# Patient Record
Sex: Male | Born: 1945 | Race: Black or African American | Hispanic: No | Marital: Married | Smoking: Never smoker
Health system: Southern US, Community
[De-identification: ages and names within clinical notes are randomized; demographics above are authoritative.]

## PROBLEM LIST (undated history)

## (undated) DIAGNOSIS — C61 Malignant neoplasm of prostate: Secondary | ICD-10-CM

## (undated) DIAGNOSIS — Z923 Personal history of irradiation: Secondary | ICD-10-CM

## (undated) HISTORY — PX: CIRCUMCISION: SUR203

---

## 2005-03-15 ENCOUNTER — Ambulatory Visit (HOSPITAL_BASED_OUTPATIENT_CLINIC_OR_DEPARTMENT_OTHER): Admission: RE | Admit: 2005-03-15 | Discharge: 2005-03-15 | Payer: Self-pay | Admitting: Urology

## 2005-03-15 ENCOUNTER — Ambulatory Visit (HOSPITAL_COMMUNITY): Admission: RE | Admit: 2005-03-15 | Discharge: 2005-03-15 | Payer: Self-pay | Admitting: Urology

## 2006-10-27 ENCOUNTER — Encounter: Admission: RE | Admit: 2006-10-27 | Discharge: 2006-10-27 | Payer: Self-pay | Admitting: General Practice

## 2010-02-25 ENCOUNTER — Encounter (INDEPENDENT_AMBULATORY_CARE_PROVIDER_SITE_OTHER): Payer: Self-pay | Admitting: *Deleted

## 2010-03-29 ENCOUNTER — Ambulatory Visit: Payer: Self-pay | Admitting: Gastroenterology

## 2010-03-29 ENCOUNTER — Encounter (INDEPENDENT_AMBULATORY_CARE_PROVIDER_SITE_OTHER): Payer: Self-pay | Admitting: *Deleted

## 2010-03-29 DIAGNOSIS — R1033 Periumbilical pain: Secondary | ICD-10-CM | POA: Insufficient documentation

## 2010-03-30 ENCOUNTER — Encounter (INDEPENDENT_AMBULATORY_CARE_PROVIDER_SITE_OTHER): Payer: Self-pay | Admitting: *Deleted

## 2010-03-30 LAB — CONVERTED CEMR LAB
AST: 27 units/L (ref 0–37)
BUN: 23 mg/dL (ref 6–23)
CO2: 27 meq/L (ref 19–32)
Calcium: 8.9 mg/dL (ref 8.4–10.5)
HCT: 43.8 % (ref 39.0–52.0)
Lymphocytes Relative: 34.3 % (ref 12.0–46.0)
Lymphs Abs: 1.8 10*3/uL (ref 0.7–4.0)
MCHC: 34.6 g/dL (ref 30.0–36.0)
Neutrophils Relative %: 46.4 % (ref 43.0–77.0)
Potassium: 4.8 meq/L (ref 3.5–5.1)
RDW: 13.4 % (ref 11.5–14.6)
Sed Rate: 5 mm/hr (ref 0–22)
Sodium: 140 meq/L (ref 135–145)

## 2010-04-26 ENCOUNTER — Ambulatory Visit: Payer: Self-pay | Admitting: Gastroenterology

## 2010-04-29 ENCOUNTER — Encounter: Payer: Self-pay | Admitting: Gastroenterology

## 2010-11-05 ENCOUNTER — Emergency Department (HOSPITAL_COMMUNITY)
Admission: EM | Admit: 2010-11-05 | Discharge: 2010-11-05 | Payer: Self-pay | Source: Home / Self Care | Admitting: Emergency Medicine

## 2010-11-08 ENCOUNTER — Emergency Department (HOSPITAL_COMMUNITY)
Admission: EM | Admit: 2010-11-08 | Discharge: 2010-11-08 | Payer: Self-pay | Source: Home / Self Care | Admitting: Emergency Medicine

## 2010-12-08 NOTE — Letter (Signed)
Summary: [facility] Instructions  [facility]  [address] [place], [place] [address]   Phone: [phone]  Fax: [phone]       BART ASHFORD    66-Jan-1947    MRN: [id number]        Procedure Day /Date:04/26/10     Arrival Time:1230 pm     Procedure Time:130 pm     Location of Procedure:                    [facility] (4th Floor)                        PREPARATION FOR COLONOSCOPY WITH MOVIPREP   Starting 5 days prior to your procedure 04/21/10 do not eat nuts, seeds, popcorn, corn, beans, peas,  salads, or any raw vegetables.  Do not take any fiber supplements (e.g. Metamucil, Citrucel, and Benefiber).  THE DAY BEFORE YOUR PROCEDURE         DATE: 04/25/10  DAY: SUN  1.  Drink clear liquids the entire day-NO SOLID FOOD  2.  Do not drink anything colored red or purple.  Avoid juices with pulp.  No orange juice.  3.  Drink at least 64 oz. (8 glasses) of fluid/clear liquids during the day to prevent dehydration and help the prep work efficiently.  CLEAR LIQUIDS INCLUDE: Water Jello Ice Popsicles Tea (sugar ok, no milk/cream) Powdered fruit flavored drinks Coffee (sugar ok, no milk/cream) Gatorade Juice: apple, white grape, white cranberry  Lemonade Clear bullion, consomm, broth Carbonated beverages (any kind) Strained chicken noodle soup Hard Candy                             4.  In the morning, mix first dose of MoviPrep solution:    Empty 1 Pouch A and 1 Pouch B into the disposable container    Add lukewarm drinking water to the top line of the container. Mix to dissolve    Refrigerate (mixed solution should be used within 24 hrs)  5.  Begin drinking the prep at 5:00 p.m. The MoviPrep container is divided by 4 marks.   Every 15 minutes drink the solution down to the next mark (approximately 8 oz) until the full liter is complete.   6.  Follow completed prep with 16 oz of clear liquid of your choice (Nothing red or purple).   Continue to drink clear liquids until bedtime.  7.  Before going to bed, mix second dose of MoviPrep solution:    Empty 1 Pouch A and 1 Pouch B into the disposable container    Add lukewarm drinking water to the top line of the container. Mix to dissolve    Refrigerate  THE DAY OF YOUR PROCEDURE      DATE:04/26/10 DAY: MON  Beginning at 830 a.m. (5 hours before procedure):         1. Every 15 minutes, drink the solution down to the next mark (approx 8 oz) until the full liter is complete.  2. Follow completed prep with 16 oz. of clear liquid of your choice.    3. You may drink clear liquids until 1130 am (2 HOURS BEFORE PROCEDURE).   MEDICATION INSTRUCTIONS  Unless otherwise instructed, you should take regular prescription medications with a small sip of water   as early as possible the morning of your procedure.  OTHER INSTRUCTIONS  You will need a responsible adult at least 66 years of age to accompany you and drive you home.   This person must remain in the waiting room during your procedure.  Wear loose fitting clothing that is easily removed.  Leave jewelry and other valuables at home.  However, you may wish to bring a book to read or  an iPod/MP3 player to listen to music as you wait for your procedure to start.  Remove all body piercing jewelry and leave at home.  Total time from sign-in until discharge is approximately 2-3 hours.  You should go home directly after your procedure and rest.  You can resume normal activities the  day after your procedure.  The day of your procedure you should not:   Drive   Make legal decisions   Operate machinery   Drink alcohol   Return to work  You will receive specific instructions about eating, activities and medications before you leave.    The above instructions have been reviewed and explained to me by   _______________________    I fully understand and can verbalize these instructions  _____________________________ Date _________

## 2010-12-08 NOTE — Procedures (Signed)
Summary: Colonoscopy  Patient: Marvin Parker Note: All result statuses are Final unless otherwise noted.  Tests: (1) Colonoscopy (COL)   COL Colonoscopy           DONE     [facility]     [address]     [place], [place]  [address]           COLONOSCOPY PROCEDURE REPORT           PATIENT:  Marvin Parker, Marvin Parker  MR#:  [id number]     BIRTHDATE:  [birth year], 63 yrs. old  GENDER:  male     ENDOSCOPIST:  Rachael Fee, MD     PROCEDURE DATE:  04/26/2010     PROCEDURE:  Colonoscopy with biopsy     ASA CLASS:  Class II     INDICATIONS:  abdominal pains     MEDICATIONS:   Fentanyl 75 mcg IV, Versed 9 mg IV           DESCRIPTION OF PROCEDURE:   After the risks benefits and     alternatives of the procedure were thoroughly explained, informed     consent was obtained.  Digital rectal exam was performed and     revealed no rectal masses.   The LB [id number] endoscope     was introduced through the anus and advanced to the terminal ileum     which was intubated for a short distance, without limitations.     The quality of the prep was excellent, using MoviPrep.  The     instrument was then slowly withdrawn as the colon was fully     examined.     <<PROCEDUREIMAGES>>     FINDINGS:  The terminal ileum appeared normal (see image3).  A     sessile polyp was found in the sigmoid colon. This was 1-67mm     across, removed with biopsy forceps (pathology jar 1) (see     image4).  This was otherwise a normal examination of the colon     (see ima     ge2, image5, and image1).   Retroflexed views in the rectum     revealed no abnormalities.    The scope was then withdrawn from     the patient and the procedure completed.           COMPLICATIONS:  None     ENDOSCOPIC IMPRESSION:     1) Normal terminal ileum     2) Sessile polyp in the sigmoid colon; removed and sent to     pathology     3) Otherwise normal examination;  intermittent abd pains are     likely from mild IBS  (irritable bowel syndrome)           RECOMMENDATIONS:  1) If the polyp(s) removed today are proven to     be adenomatous (pre-cancerous) polyps, you will need a repeat     colonoscopy in 5 years. Otherwise you should continue to follow     colorectal cancer screening guidelines for "routine risk" patients     with colonosc     opy in 10 years.     2) You will receive a letter within 1-2 weeks with the results     of your biopsy as well as final recommendations. Please call my     office if you have not received a letter after 3 weeks.           ______________________________  Rachael Fee, MD           n.     Rosalie Doctor:   Rachael Fee at 04/26/2010 01:52 PM           [place], [place], [phone]  Note: An exclamation mark (!) indicates a result that was not dispersed into the flowsheet. Document Creation Date: 04/26/2010 1:53 PM _______________________________________________________________________  (1) Order result status: Final Collection or observation date-time: 04/26/2010 13:47 Requested date-time:  Receipt date-time:  Reported date-time:  Referring Physician:   Ordering Physician: Rob Bunting [phone]) Specimen Source:  Source: Launa Grill Order Number: [id number] Lab site:   Appended Document: Colonoscopyn recall 10 yr     Procedures Next Due Date:    Colonoscopy: 04/2020

## 2010-12-08 NOTE — Assessment & Plan Note (Signed)
History of Present Illness Visit Type: new patient  Primary GI MD: Rob Bunting MD Primary Provider: [facility]([address]) Requesting Provider: na Chief Complaint: stomach problems  History of Present Illness:     very pleasant 65 year old man who has lower abdominal discomfort.  The pains are every day.  Very bothersome burning.  Sometims LUQ discomfort as well.  This has been going on for 5-6 years.  Normal daily BMs, without straining.  Never seen blood in his stool.  Overall his weight is overall stable.  Passing gas will often help, but BMs do not.    nothing tends to bring the discomforts on.            Current Medications (verified): 1)  Tamsulosin Hcl 0.4 Mg Caps (Tamsulosin Hcl) .... One Capsule By Mouth At Bedtime 2)  Zyrtec Allergy 10 Mg Caps (Cetirizine Hcl) .... As Needed  Allergies (verified): No Known Drug Allergies  Past History:  Past Medical History: none  Past Surgical History: none  Family History: no colon cancer  Social History: he is separated, he has 5 children, he is a Company secretary, he does not smoke cigarettes, he drinks one to 2 alcoholic beverages per day  Review of Systems       Pertinent positive and negative review of systems were noted in the above HPI and GI specific review of systems.  All other review of systems was otherwise negative.   Vital Signs:  Patient profile:   65 year old male Height:      68 inches Weight:      173 pounds BMI:     26.40 BSA:     1.92 Pulse rate:   92 / minute Pulse rhythm:   regular BP sitting:   136 / 82  (left arm) Cuff size:   regular  Vitals Entered By: Ok Anis CMA (Mar 29, 2010 10:57 AM)  Physical Exam  Additional Exam:  Constitutional: generally well appearing Psychiatric: alert and oriented times 3 Eyes: extraocular movements intact Mouth: oropharynx moist, no lesions Neck: supple, no lymphadenopathy Cardiovascular: heart regular rate and rythm Lungs: CTA  bilaterally Abdomen: soft, non-tender, non-distended, no obvious ascites, no peritoneal signs, normal bowel sounds Extremities: no lower extremity edema bilaterally Skin: no lesions on visible extremities    Impression & Recommendations:  Problem # 1:  Lower abdominal discomforts passing gas and generally improves his symptoms and so I suspect this is an irritable bowel-like issue.  He has no alarm symptoms but he is never had colon cancer screening from what I can tell. We will arrange for colonoscopy to be done at his soonest convenience. He'll also get a basic set of labs including a CBC, complete metabolic profile, sedimentation rate.  Other Orders: TLB-CBC Platelet - w/Differential (85025-CBCD) TLB-CMP (Comprehensive Metabolic Pnl) (80053-COMP) TLB-Sedimentation Rate (ESR) (85652-ESR)  Patient Instructions: 1)  You will be scheduled to have a colonoscopy. 2)  You will get lab test(s) done today (cbc, cmet, esr). 3)  The medication list was reviewed and reconciled.  All changed / newly prescribed medications were explained.  A complete medication list was provided to the patient / caregiver.  Appended Document: Orders Update/movi    Clinical Lists Changes  Medications: Added new medication of MOVIPREP 100 GM  SOLR (PEG-KCL-NACL-NASULF-NA ASC-C) As per prep instructions. - Signed Rx of MOVIPREP 100 GM  SOLR (PEG-KCL-NACL-NASULF-NA ASC-C) As per prep instructions.;  #1 x 0;  Signed;  Entered by: Chales Abrahams CMA (AAMA);  Authorized by:  Rachael Fee MD;  Method used: [facility] to [facility]. [phone]*, [address], [place], [place]  [address], Ph: [phone] or [phone], Fax: [phone] Orders: Added new Test order of Colonoscopy (Colon) - Signed    Prescriptions: MOVIPREP 100 GM  SOLR (PEG-KCL-NACL-NASULF-NA ASC-C) As per prep instructions.  #1 x 0   Entered by:   Chales Abrahams CMA (AAMA)   Authorized by:   Rachael Fee MD   Signed by:   Chales Abrahams CMA  (AAMA) on 03/29/2010   Method used:   [facility] to        [facility]. [phone]* (retail)       [address]       [place], [place]  [address]       Ph: [phone] or [phone]       Fax: [phone]   RxID:   [id number]

## 2010-12-08 NOTE — Letter (Signed)
Summary: Results Letter  [facility]  [address] [place], [place] [address]   Phone: [phone]  Fax: [phone]        April 29, 2010 MRN: [id number]    Marvin Parker [address] [place], [place]  [address]    Dear [name],   Good news.  The polyp(s) that were removed during your recent procedure were NOT pre-cancerous.  You should continue to follow current colorectal cancer screening guidelines with a repeat colonoscopy in 10 years.  We will therefore put your information in our reminder system and will contact you in 10 years to schedule a repeat procedure.  Please call if you have any questions or concerns.       Sincerely,  Rachael Fee MD  This letter has been electronically signed by your physician.  Appended Document: Results Letter letter mailed.

## 2010-12-08 NOTE — Letter (Signed)
Summary: New Patient letter  [facility]  [address] [place], [place] [address]   Phone: [phone]  Fax: [phone]       02/25/2010 MRN: [id number]  MAKAR SLATTER [address] [place], [place]  [address]  Dear Mr. FIELDEN,  Welcome to the Gastroenterology Division at [facility].    You are scheduled to see Dr.  Rob Bunting on Mar 29, 2010 at 11:00am on the 3rd floor at [facility], [address]  We ask that you try to arrive at our office 15 minutes prior to your appointment time to allow for check-in.  We would like you to complete the enclosed self-administered evaluation form prior to your visit and bring it with you on the day of your appointment.  We will review it with you.  Also, please bring a complete list of all your medications or, if you prefer, bring the medication bottles and we will list them.  Please bring your insurance card so that we may make a copy of it.  If your insurance requires a referral to see a specialist, please bring your referral form from your primary care physician.  Co-payments are due at the time of your visit and may be paid by cash, check or credit card.     Your office visit will consist of a consult with your physician (includes a physical exam), any laboratory testing he/she may order, scheduling of any necessary diagnostic testing (e.g. x-ray, ultrasound, CT-scan), and scheduling of a procedure (e.g. Endoscopy, Colonoscopy) if required.  Please allow enough time on your schedule to allow for any/all of these possibilities.    If you cannot keep your appointment, please call [phone] to cancel or reschedule prior to your appointment date.  This allows [place] the opportunity to schedule an appointment for another patient in need of care.  If you do not cancel or reschedule by 5 p.m. the business day prior to your appointment date, you will be charged a $50.00 late cancellation/no-show fee.    Thank you for  choosing [facility] for your medical needs.  We appreciate the opportunity to care for you.  Please visit [place] at our website  to learn more about our practice.                     Sincerely,                                                             The Gastroenterology Division

## 2010-12-08 NOTE — Letter (Signed)
Summary: Results Letter  [facility]  [address] [place], [place] [address]   Phone: [phone]  Fax: [phone]        Mar 30, 2010 MRN: [id number]    Marvin Parker [address] [place], [place]  [address]    Dear [name],  I have been unable to reach you by phone in regards to your lab results.  The home phone number that we have in our system is incorrect.  Please call our office so that we may correct this information.  Thank you for your time and attention.          Sincerely,  Chales Abrahams CMA (AAMA)  This letter has been electronically signed by your physician.  Appended Document: Results Letter letter mailed

## 2011-01-17 LAB — POCT I-STAT, CHEM 8
BUN: 27 mg/dL — ABNORMAL HIGH (ref 6–23)
Creatinine, Ser: 1.1 mg/dL (ref 0.4–1.5)
Potassium: 4.3 mEq/L (ref 3.5–5.1)
Sodium: 139 mEq/L (ref 135–145)

## 2011-03-25 NOTE — Op Note (Signed)
Marvin Parker, Marvin Parker               ACCOUNT NO.:  [id number]   MEDICAL RECORD NO.:  [id number]          PATIENT TYPE:  AMB   LOCATION:  [facility]                         FACILITY:  [facility]   PHYSICIAN:  Boston Service, M.D.DATE OF BIRTH:  [birth year]   DATE OF PROCEDURE:  03/15/2005  DATE OF DISCHARGE:                                 OPERATIVE REPORT   References made office note from Mar 08, 2005.   PREOPERATIVE DIAGNOSES:  A 65 year old male, native of [place], phimosis  balanitis, requests circumcision.   POSTOPERATIVE DIAGNOSES:  A 65 year old male, native of [place], phimosis  balanitis, requests circumcision.   PROCEDURE:  Circumcision.   SURGEON:  Boston Service, M.D.   ASSISTANT:  None.   ANESTHESIA:  General.   SPECIMENS:  Foreskin.   ESTIMATED BLOOD LOSS:  Minimal.   COMPLICATIONS:  None obvious.   DESCRIPTION OF PROCEDURE:  The patient was prepped and draped in the supine  position after institution of an adequate level of general anesthesia.  Penile block 0.25% lidocaine without epinephrine was instituted, a total of  about 8-12 mL were used. A circumferential incision was then made proximal  to the subcoronal sulcus. A similar incision was made proximal to the  original incision and a ring of redundant preputial skin was removed in a  parallel lines technique.  Bleeding sites were lightly cauterized with  needle tip Bovie, subcu was reapproximated with  interrupted sutures of 4-0 Vicryl. The skin was reapproximated with  interrupted sutures of 4-0 chromic. The wound was covered with bacitracin  ointment, dry gauze and Coban tape. The patient was returned to recovery in  satisfactory condition.      RH/MEDQ  D:  03/15/2005  T:  03/15/2005  Job:  [id number]   cc:   Gabriel Earing, M.D.  [address]  [place]  [place] [address]  Fax: [phone]

## 2012-07-02 ENCOUNTER — Encounter (HOSPITAL_COMMUNITY): Payer: Self-pay

## 2012-07-02 ENCOUNTER — Emergency Department (HOSPITAL_COMMUNITY)
Admission: EM | Admit: 2012-07-02 | Discharge: 2012-07-02 | Disposition: A | Discharge status: Home / Self Care | Payer: Medicare Other | Attending: Emergency Medicine | Admitting: Emergency Medicine

## 2012-07-02 ENCOUNTER — Emergency Department (HOSPITAL_COMMUNITY): Payer: Medicare Other

## 2012-07-02 DIAGNOSIS — S5000XA Contusion of unspecified elbow, initial encounter: Secondary | ICD-10-CM | POA: Insufficient documentation

## 2012-07-02 DIAGNOSIS — Y990 Civilian activity done for income or pay: Secondary | ICD-10-CM | POA: Insufficient documentation

## 2012-07-02 DIAGNOSIS — M545 Low back pain, unspecified: Secondary | ICD-10-CM

## 2012-07-02 DIAGNOSIS — Y9289 Other specified places as the place of occurrence of the external cause: Secondary | ICD-10-CM | POA: Insufficient documentation

## 2012-07-02 DIAGNOSIS — W19XXXA Unspecified fall, initial encounter: Secondary | ICD-10-CM | POA: Insufficient documentation

## 2012-07-02 DIAGNOSIS — S5002XA Contusion of left elbow, initial encounter: Secondary | ICD-10-CM

## 2012-07-02 DIAGNOSIS — S6390XA Sprain of unspecified part of unspecified wrist and hand, initial encounter: Secondary | ICD-10-CM | POA: Insufficient documentation

## 2012-07-02 DIAGNOSIS — S63619A Unspecified sprain of unspecified finger, initial encounter: Secondary | ICD-10-CM

## 2012-07-02 LAB — URINALYSIS, ROUTINE W REFLEX MICROSCOPIC
Bilirubin Urine: NEGATIVE
Glucose, UA: NEGATIVE mg/dL
Hgb urine dipstick: NEGATIVE
Ketones, ur: NEGATIVE mg/dL
Leukocytes, UA: NEGATIVE
Nitrite: NEGATIVE
Protein, ur: NEGATIVE mg/dL
Specific Gravity, Urine: 1.028 (ref 1.005–1.030)
Urobilinogen, UA: 0.2 mg/dL (ref 0.0–1.0)
pH: 5.5 (ref 5.0–8.0)

## 2012-07-02 MED ORDER — HYDROCODONE-ACETAMINOPHEN 5-325 MG PO TABS
2.0000 | ORAL_TABLET | Freq: Once | ORAL | Status: AC
  Administered 2012-07-02: 2 via ORAL
  Filled 2012-07-02: qty 2

## 2012-07-02 MED ORDER — DOCUSATE SODIUM 100 MG PO CAPS: 100.0000 mg | ORAL_CAPSULE | Freq: Two times a day (BID) | ORAL | Status: AC | PRN

## 2012-07-02 MED ORDER — HYDROCODONE-ACETAMINOPHEN 5-325 MG PO TABS: ORAL_TABLET | ORAL | Status: AC

## 2012-07-02 NOTE — Discharge Instructions (Signed)
Back Exercises   Back exercises help treat and prevent back injuries. The goal of back exercises is to increase the strength of your abdominal and back muscles and the flexibility of your back. These exercises should be started when you no longer have back pain. Back exercises include:   Pelvic Tilt. Lie on your back with your knees bent. Tilt your pelvis until the lower part of your back is against the floor. Hold this position 5 to 10 sec and repeat 5 to 10 times.   Knee to Chest. Pull first 1 knee up against your chest and hold for 20 to 30 seconds, repeat this with the other knee, and then both knees. This may be done with the other leg straight or bent, whichever feels better.   Sit-Ups or Curl-Ups. Bend your knees 90 degrees. Start with tilting your pelvis, and do a partial, slow sit-up, lifting your trunk only 30 to 45 degrees off the floor. Take at least 2 to 3 seconds for each sit-up. Do not do sit-ups with your knees out straight. If partial sit-ups are difficult, simply do the above but with only tightening your abdominal muscles and holding it as directed.   Hip-Lift. Lie on your back with your knees flexed 90 degrees. Push down with your feet and shoulders as you raise your hips a couple inches off the floor; hold for 10 seconds, repeat 5 to 10 times.   Back arches. Lie on your stomach, propping yourself up on bent elbows. Slowly press on your hands, causing an arch in your low back. Repeat 3 to 5 times. Any initial stiffness and discomfort should lessen with repetition over time.   Shoulder-Lifts. Lie face down with arms beside your body. Keep hips and torso pressed to floor as you slowly lift your head and shoulders off the floor.   Do not overdo your exercises, especially in the beginning. Exercises may cause you some mild back discomfort which lasts for a few minutes; however, if the pain is more severe, or lasts for more than 15 minutes, do not continue exercises until you see your caregiver.  Improvement with exercise therapy for back problems is slow.   See your caregivers for assistance with developing a proper back exercise program.   Document Released: 12/01/2004 Document Revised: 10/13/2011 Document Reviewed: 10/24/2005   ExitCare® Patient Information ©2012 [employer].     Back Pain, Adult   Low back pain is very common. About 1 in 5 people have back pain. The cause of low back pain is rarely dangerous. The pain often gets better over time. About half of people with a sudden onset of back pain feel better in just 2 weeks. About 8 in 10 people feel better by 6 weeks.   CAUSES   Some common causes of back pain include:   Strain of the muscles or ligaments supporting the spine.   Wear and tear (degeneration) of the spinal discs.   Arthritis.   Direct injury to the back.   DIAGNOSIS   Most of the time, the direct cause of low back pain is not known. However, back pain can be treated effectively even when the exact cause of the pain is unknown. Answering your caregiver's questions about your overall health and symptoms is one of the most accurate ways to make sure the cause of your pain is not dangerous. If your caregiver needs more information, he or she may order lab work or imaging tests (X-rays or MRIs). However, even   if imaging tests show changes in your back, this usually does not require surgery.   HOME CARE INSTRUCTIONS   For many people, back pain returns. Since low back pain is rarely dangerous, it is often a condition that people can learn to manage on their own.   Remain active. It is stressful on the back to sit or stand in one place. Do not sit, drive, or stand in one place for more than 30 minutes at a time. Take short walks on level surfaces as soon as pain allows. Try to increase the length of time you walk each day.   Do not stay in bed. Resting more than 1 or 2 days can delay your recovery.   Do not avoid exercise or work. Your body is made to move. It is not dangerous to be active,  even though your back may hurt. Your back will likely heal faster if you return to being active before your pain is gone.   Pay attention to your body when you bend and lift. Many people have less discomfort when lifting if they bend their knees, keep the load close to their bodies, and avoid twisting. Often, the most comfortable positions are those that put less stress on your recovering back.   Find a comfortable position to sleep. Use a firm mattress and lie on your side with your knees slightly bent. If you lie on your back, put a pillow under your knees.   Only take over-the-counter or prescription medicines as directed by your caregiver. Over-the-counter medicines to reduce pain and inflammation are often the most helpful. Your caregiver may prescribe muscle relaxant drugs. These medicines help dull your pain so you can more quickly return to your normal activities and healthy exercise.   Put ice on the injured area.   Put ice in a plastic bag.   Place a towel between your skin and the bag.   Leave the ice on for 15 to 20 minutes, 3 to 4 times a day for the first 2 to 3 days. After that, ice and heat may be alternated to reduce pain and spasms.   Ask your caregiver about trying back exercises and gentle massage. This may be of some benefit.   Avoid feeling anxious or stressed. Stress increases muscle tension and can worsen back pain. It is important to recognize when you are anxious or stressed and learn ways to manage it. Exercise is a great option.   SEEK MEDICAL CARE IF:   You have pain that is not relieved with rest or medicine.   You have pain that does not improve in 1 week.   You have new symptoms.   You are generally not feeling well.   SEEK IMMEDIATE MEDICAL CARE IF:   You have pain that radiates from your back into your legs.   You develop new bowel or bladder control problems.   You have unusual weakness or numbness in your arms or legs.   You develop nausea or vomiting.   You develop abdominal  pain.   You feel faint.   Document Released: 10/24/2005 Document Revised: 10/13/2011 Document Reviewed: 03/14/2011   ExitCare® Patient Information ©2012 [employer].

## 2012-07-02 NOTE — ED Provider Notes (Signed)
History   This chart was scribed for Marvin Parker. Oletta Lamas, MD by Melba Coon. The patient was seen in room TR04C/TR04C and the patient's care was started at 2:43PM.    CSN: [id number]  Arrival date & time 07/02/12  1338   None     Chief Complaint  Patient presents with  . Fall    (Consider location/radiation/quality/duration/timing/severity/associated sxs/prior treatment) The history is provided by the patient. No language interpreter was used.   Zaven Currier is a 66 y.o. male who presents to the Emergency Department complaining of constant, moderate to severe back pain and left arm pain pertaining to a fall with no head contact or LOC with an onset 2 days ago. Pt fell on the left side of his back, left elbow, and his left hand 5th digit when he lost balance at work around a conveyor belt. Pt is ambulatory but decreased compared to baseline.Tylenol and rest slightly alleviated the pain. No HA, fever, neck pain, sore throat, rash, CP, SOB, abd pain, n/v/d, bowel or urinary dysfunction, or extremity edema, weakness, numbness, or tingling. Pt also c/o dysuria and difficulty voiding. No known allergies. No other pertinent medical symptoms.  No PCP  History reviewed. No pertinent past medical history.  History reviewed. No pertinent past surgical history.  History reviewed. No pertinent family history.  History  Substance Use Topics  . Smoking status: Never Smoker   . Smokeless tobacco: Not on file  . Alcohol Use: No      Review of Systems  Constitutional: Negative for fever and chills.  HENT: Negative for neck pain and neck stiffness.   Respiratory: Negative for shortness of breath.   Gastrointestinal: Negative for nausea, vomiting and abdominal pain.  Genitourinary: Positive for dysuria. Negative for frequency and difficulty urinating.  Musculoskeletal: Positive for back pain, joint swelling and arthralgias.  Skin: Positive for wound.  Neurological: Negative for weakness and  headaches.      Allergies  Review of patient's allergies indicates no known allergies.  Home Medications   Current Outpatient Rx  Name Route Sig Dispense Refill  . CYANOCOBALAMIN 100 MCG PO TABS Oral Take 100 mcg by mouth daily.    . IBUPROFEN 200 MG PO TABS Oral Take 200 mg by mouth every 6 (six) hours as needed. For pain    . DOCUSATE SODIUM 100 MG PO CAPS Oral Take 1 capsule (100 mg total) by mouth 2 (two) times daily as needed for constipation. 30 capsule 0  . HYDROCODONE-ACETAMINOPHEN 5-325 MG PO TABS  1-2 tablets po q 6 hours prn moderate to severe pain 20 tablet 0    BP 130/66  Pulse 62  Temp 97.8 F (36.6 C) (Oral)  Resp 18  SpO2 98%  Physical Exam  Nursing note and vitals reviewed. Constitutional: He is oriented to person, place, and time. He appears well-developed and well-nourished. No distress.  HENT:  Head: Normocephalic and atraumatic.  Eyes: EOM are normal.  Neck: Neck supple. No tracheal deviation present.  Cardiovascular: Normal rate.        God radial pulse of LUE.  Pulmonary/Chest: Effort normal. No respiratory distress.  Musculoskeletal: Normal range of motion. He exhibits edema (Diffuse edema of left hand, 5th digit) and tenderness (Mild paraspinal lumbar tenderness).       Slightly diminished ROM of left hand, 5th digit. Full ROM of the left elbow.  Neurological: He is alert and oriented to person, place, and time.       1+ bilateral patellar reflexes; nml  distal strength and sensation in bilateral lower extremities.  Skin: Skin is warm and dry.       1.5cm x 1cm skin laceration on left elbow with minimal purulent drainage but no surrounding erythema. 0.5cm, old healing laceration on right palm that is healing well.   Psychiatric: He has a normal mood and affect. His behavior is normal.    ED Course  Procedures (including critical care time)  DIAGNOSTIC STUDIES: Oxygen Saturation is 98% on room air, normal by my interpretation.    COORDINATION  OF CARE:  2:48PM - lumbar spine XR, sacrum/coccyx XR, left pinky finger XR, and UA will be ordered for the pt.    Labs Reviewed  URINALYSIS, ROUTINE W REFLEX MICROSCOPIC   Dg Lumbar Spine Complete  07/02/2012  *RADIOLOGY REPORT*  Clinical Data: Low back pain after falling.  LUMBAR SPINE - COMPLETE 4+ VIEW  Comparison: CT 12/07/2006.  Radiographs 10/27/2006.  Findings: There are five lumbar type vertebral bodies.  There is a stable mild convex left scoliosis.  There is stable mild disc space loss and intervertebral spurring at L3-L4 and L4-L5.  There is mild facet disease inferiorly.  No fracture or pars defect is identified.  IMPRESSION: Stable mild spondylosis and scoliosis.  No acute osseous findings.   Original Report Authenticated By: Gerrianne Scale, M.D.    Dg Sacrum/coccyx  07/02/2012  *RADIOLOGY REPORT*  Clinical Data: Buttock pain status post fall.  SACRUM AND COCCYX - 2+ VIEW  Comparison: Pelvic CT 12/07/2006.  Findings: No evidence of acute fracture or dislocation.  The sacroiliac joints appear normal bilaterally.  Lower lumbar spondylosis appears stable.  IMPRESSION: No acute osseous findings.   Original Report Authenticated By: Gerrianne Scale, M.D.    Dg Finger Little Left  07/02/2012  *RADIOLOGY REPORT*  Clinical Data: Little finger pain status post fall.  LEFT LITTLE FINGER 2+V  Comparison: None.  Findings: The mineralization and alignment are normal.  There is no evidence of acute fracture or dislocation.  There appears to be mild soft tissue swelling around the proximal interphalangeal joint.  IMPRESSION: No acute osseous findings.  Proximal interphalangeal joint soft tissue swelling.   Original Report Authenticated By: Gerrianne Scale, M.D.      1. Lumbar back pain   2. Sprain of finger   3. Contusion of left elbow       MDM  I personally performed the services described in this documentation, which was scribed in my presence. The recorded information has been  reviewed and considered.   Pt with good distal strength, no numbness or weakness radiating.  UA is ok.  Plain films neg.  Will redress elbow wound and then       Marvin Parker. Delmi Fulfer, MD 07/02/12 1623

## 2012-07-02 NOTE — ED Notes (Addendum)
Pt fell at work, complains of left finger pain, left elbow pain and also has sore to palm of left hand.

## 2012-12-24 ENCOUNTER — Encounter (HOSPITAL_COMMUNITY): Payer: Self-pay | Admitting: *Deleted

## 2012-12-24 ENCOUNTER — Emergency Department (HOSPITAL_COMMUNITY)
Admission: EM | Admit: 2012-12-24 | Discharge: 2012-12-24 | Disposition: A | Discharge status: Home / Self Care | Payer: Worker's Compensation | Attending: Emergency Medicine | Admitting: Emergency Medicine

## 2012-12-24 ENCOUNTER — Emergency Department (HOSPITAL_COMMUNITY): Payer: Worker's Compensation

## 2012-12-24 DIAGNOSIS — S61209A Unspecified open wound of unspecified finger without damage to nail, initial encounter: Secondary | ICD-10-CM | POA: Insufficient documentation

## 2012-12-24 DIAGNOSIS — Y9269 Other specified industrial and construction area as the place of occurrence of the external cause: Secondary | ICD-10-CM | POA: Insufficient documentation

## 2012-12-24 DIAGNOSIS — Y9389 Activity, other specified: Secondary | ICD-10-CM | POA: Insufficient documentation

## 2012-12-24 DIAGNOSIS — Z23 Encounter for immunization: Secondary | ICD-10-CM | POA: Insufficient documentation

## 2012-12-24 DIAGNOSIS — W230XXA Caught, crushed, jammed, or pinched between moving objects, initial encounter: Secondary | ICD-10-CM | POA: Insufficient documentation

## 2012-12-24 DIAGNOSIS — Y990 Civilian activity done for income or pay: Secondary | ICD-10-CM | POA: Insufficient documentation

## 2012-12-24 MED ORDER — TETANUS-DIPHTH-ACELL PERTUSSIS 5-2.5-18.5 LF-MCG/0.5 IM SUSP
0.5000 mL | Freq: Once | INTRAMUSCULAR | Status: AC
  Administered 2012-12-24: 0.5 mL via INTRAMUSCULAR
  Filled 2012-12-24: qty 0.5

## 2012-12-24 NOTE — ED Notes (Signed)
Pt transported to radiology.

## 2012-12-24 NOTE — ED Notes (Signed)
PA at bedside.

## 2012-12-24 NOTE — ED Provider Notes (Signed)
History     CSN: [id number]  Arrival date & time 12/24/12  1229   First MD Initiated Contact with Patient 12/24/12 1501      Chief Complaint  Patient presents with  . Finger Injury    (Consider location/radiation/quality/duration/timing/severity/associated sxs/prior treatment) HPI Comments: Patient presents for pain on his right 4th finger. Patient works at a [employer] and states he was opening a rolling door when his fingernail got caught on the handle. States is fingernail and the top part of his finger got ripped off. Patient states it is painful; pain is nonradiating. Severity is unchanged since initial incident at 8am this morning. Patient states his does not know when his last tetanus shot was.  The history is provided by the patient. No language interpreter was used.    History reviewed. No pertinent past medical history.  History reviewed. No pertinent past surgical history.  No family history on file.  History  Substance Use Topics  . Smoking status: Never Smoker   . Smokeless tobacco: Not on file  . Alcohol Use: No    Review of Systems  Constitutional: Negative for fever.  Musculoskeletal: Negative for joint swelling.  Skin: Positive for wound. Negative for pallor.  Neurological: Negative for weakness and numbness.  All other systems reviewed and are negative.    Allergies  Review of patient's allergies indicates no known allergies.  Home Medications  No current outpatient prescriptions on file.  BP 137/75  Pulse 65  Temp(Src) 98 F (36.7 C) (Oral)  Resp 18  SpO2 97%  Physical Exam  Constitutional: He is oriented to person, place, and time. He appears well-developed and well-nourished. No distress.  HENT:  Head: Normocephalic and atraumatic.  Eyes: No scleral icterus.  Neck: Normal range of motion.  Cardiovascular: Intact distal pulses.   Capillary refill in b/l fingertips <2 seconds  Pulmonary/Chest: Effort normal.  Musculoskeletal:  Normal range of motion. He exhibits tenderness. He exhibits no edema.       Right hand: He exhibits tenderness and laceration. He exhibits normal range of motion, no bony tenderness, normal capillary refill, no deformity and no swelling. Normal sensation noted. Normal strength noted.       Hands: Patient's 4th digit on R hand is missing half of the fingernail as well as the distal portion of the top of his fingertip  Neurological: He is alert and oriented to person, place, and time. He has normal strength. No sensory deficit.  Skin: He is not diaphoretic.  Psychiatric: He has a normal mood and affect. His behavior is normal.    ED Course  Procedures (including critical care time)  Labs Reviewed - No data to display Dg Hand Complete Right  12/24/2012  *RADIOLOGY REPORT*  Clinical Data: Injury, pain  RIGHT HAND - COMPLETE 3+ VIEW  Comparison: None.  Findings: Soft tissue injury of the right fourth finger distally. Normal alignment.  No subluxation, dislocation, or displaced fracture.  No significant arthropathy.  IMPRESSION: No acute osseous finding.   Original Report Authenticated By: Judie Petit. Shick, M.D.      1. Avulsion of fingertip      MDM  Patient presents with avulsion of his 4th right finger after opening a rolling door, like a garage door, while at work today. Patient missing half of the fingernail on his right 4th digit as well as the distal portion of the top of his fingertip. Right hand ROM normal, neurovascularly intact. Personally interpreted Xray which shows no fracture or bony  abnormality. Patient given tetanus while in ED today. Will give referral to hand for further follow up. Wound dressed appropriately prior to d/c by Adrian Prows, RN. Patient told to return to ED if worsens or signs of infection develop. Patient will not be started on abx today as the wound does not show signs of infection and the patient does not have any PMH that puts him at increased risk of infection. Have  discussed the patient and management plan with Elpidio Anis, PA-C.  Filed Vitals:   12/24/12 1244 12/24/12 1501  BP: 124/55 137/75  Pulse: 64 65  Temp: 98 F (36.7 C)   TempSrc: Oral   Resp:  18  SpO2: 98% 97%           Antony Madura, PA-C 12/24/12 1620

## 2012-12-24 NOTE — ED Notes (Signed)
Part of nail and skin torn off from crush injury to right 4th finger.

## 2012-12-24 NOTE — ED Notes (Signed)
Pt works at [employer] and had a spring-loaded handle catch the nail of his 4th R digit and rip it off the nailbed.

## 2012-12-25 ENCOUNTER — Telehealth (HOSPITAL_COMMUNITY): Payer: Self-pay | Admitting: Emergency Medicine

## 2012-12-25 NOTE — ED Notes (Addendum)
Call from Dr Ronie Spies office for name of referring MD.  Not found on chart review screen or PA's note.  Found MD name on AVS summary, Dr Silverio Lay.  Name given to Dr Caryl Bis office

## 2012-12-26 NOTE — ED Provider Notes (Signed)
Medical screening examination/treatment/procedure(s) were performed by non-physician practitioner and as supervising physician I was immediately available for consultation/collaboration.   Richardean Canal, MD 12/26/12 [phone]

## 2014-02-17 DIAGNOSIS — C61 Malignant neoplasm of prostate: Secondary | ICD-10-CM

## 2014-02-17 HISTORY — PX: PROSTATE BIOPSY: SHX241

## 2014-02-17 HISTORY — DX: Malignant neoplasm of prostate: C61

## 2014-04-08 ENCOUNTER — Encounter: Payer: Self-pay | Admitting: Radiation Oncology

## 2014-04-08 NOTE — Progress Notes (Signed)
GU Location of Tumor / Histology: prostate  If Prostate Cancer, Gleason Score is (3 + 3) and PSA is (6.55 on 12/30/13) 08/29/13 PSA 5.36  Patient presented 3 months ago with signs/symptoms of: sensation of incomplete bladder emptying, PSA continuing to rise  Biopsies of prostate (if applicable) revealed: volume 91.34 cc 02/17/14    Past/Anticipated interventions by urology, if any: biopsy  Past/Anticipated interventions by medical oncology, if any: none  Weight changes, if any: no  Bowel/Bladder complaints, if any:  Urgency, sensation of incomplete bladder emptying, stream starts/stops, strain to urinate,  IPSS 12  Nausea/Vomiting, if any: no  Pain issues, if any:  no  SAFETY ISSUES:  Prior radiation? no  Pacemaker/ICD? no  Possible current pregnancy? na  Is the patient on methotrexate? no  Current Complaints / other details:  [place], 4 sons, 1 daughter, 7 grandchildren, moved from [place] to [place] in 1980

## 2014-04-10 ENCOUNTER — Ambulatory Visit: Admission: RE | Admit: 2014-04-10 | Payer: PRIVATE HEALTH INSURANCE | Source: Ambulatory Visit

## 2014-04-10 ENCOUNTER — Ambulatory Visit
Admission: RE | Admit: 2014-04-10 | Discharge: 2014-04-10 | Disposition: A | Discharge status: Home / Self Care | Payer: PRIVATE HEALTH INSURANCE | Source: Ambulatory Visit | Attending: Radiation Oncology | Admitting: Radiation Oncology

## 2014-04-10 DIAGNOSIS — N529 Male erectile dysfunction, unspecified: Secondary | ICD-10-CM | POA: Insufficient documentation

## 2014-04-10 DIAGNOSIS — Z510 Encounter for antineoplastic radiation therapy: Secondary | ICD-10-CM | POA: Insufficient documentation

## 2014-04-10 DIAGNOSIS — C61 Malignant neoplasm of prostate: Secondary | ICD-10-CM | POA: Insufficient documentation

## 2014-04-10 HISTORY — DX: Malignant neoplasm of prostate: C61

## 2014-04-17 ENCOUNTER — Encounter: Payer: Self-pay | Admitting: Radiation Oncology

## 2014-04-23 ENCOUNTER — Ambulatory Visit
Admission: RE | Admit: 2014-04-23 | Discharge: 2014-04-23 | Disposition: A | Discharge status: Home / Self Care | Payer: PRIVATE HEALTH INSURANCE | Source: Ambulatory Visit | Attending: Radiation Oncology | Admitting: Radiation Oncology

## 2014-04-23 ENCOUNTER — Encounter: Payer: Self-pay | Admitting: Radiation Oncology

## 2014-04-23 VITALS — BP 143/82 | HR 65 | Temp 98.3°F | Resp 20 | Ht 68.0 in | Wt 185.1 lb

## 2014-04-23 DIAGNOSIS — C61 Malignant neoplasm of prostate: Secondary | ICD-10-CM | POA: Insufficient documentation

## 2014-04-23 DIAGNOSIS — N529 Male erectile dysfunction, unspecified: Secondary | ICD-10-CM | POA: Diagnosis not present

## 2014-04-23 DIAGNOSIS — Z510 Encounter for antineoplastic radiation therapy: Secondary | ICD-10-CM | POA: Diagnosis present

## 2014-04-23 NOTE — Progress Notes (Signed)
Please see the Nurse Progress Note in the MD Initial Consult Encounter for this patient. 

## 2014-04-23 NOTE — Progress Notes (Signed)
[facility] NEW PATIENT EVALUATION  Name: Marvin Parker MRN: [id number]  Date:   04/23/2014           DOB: 19-Nov-1945  Status: outpatient   CC: PALAGRUTO,DOMENIC, MD  Arvil Persons, MD    REFERRING PHYSICIAN: Arvil Persons, MD   DIAGNOSIS: Stage TI C. favorable risk adenocarcinoma prostate   HISTORY OF PRESENT ILLNESS:  Marvin Parker is a 68 y.o. male who is seen today through the courtesy of Dr. Janice Norrie for consideration of radiation therapy in the management of his stage TI C. favorable risk adenocarcinoma prostate. He had been seeing Dr. Janice Norrie for the sensation of incomplete voiding. He was noted to have an elevated PSA of 6.55 on 12/30/2013. He underwent ultrasound-guided biopsies by Dr. Janice Norrie on 02/17/2014. His then had Gleason 6 (3+3) involving 50% of one core from right lateral base come attempt percent of one core from right lateral apex, 10% of one core from the right apex, 10% of one core from the left lateral base, and 10% of one core from the left base for a total of 5 cores containing carcinoma. His PSA in October 2014 was 5.36. His gland volume was found to be 91 cc. He is doing reasonably well from a GU and GI standpoint. His I PSS score is 12. He claims to be potent but does have mild erectile dysfunction.  PREVIOUS RADIATION THERAPY: No   PAST MEDICAL HISTORY:  has a past medical history of Prostate cancer (02/17/14).     PAST SURGICAL HISTORY:  Past Surgical History  Procedure Laterality Date  . Circumcision    . Prostate biopsy  02/17/14    gleason 3+3=6, 91.34 cc     FAMILY HISTORY: His father died at age 7, and his mother died at 42 both from "old age". No family history of prostate cancer.  SOCIAL HISTORY:  reports that he has never smoked. He does not have any smokeless tobacco history on file. He reports that he does not drink alcohol or use illicit drugs. He is a native of [place]. Married, 5 children. 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   ALLERGIES: Review of patient's allergies indicates no known allergies.   MEDICATIONS:  Current Outpatient Prescriptions  Medication Sig Dispense Refill  . acetaminophen (TYLENOL) 100 MG/ML solution Take 10 mg/kg by mouth every 4 (four) hours as needed for fever.      . Avanafil (STENDRA) 100 MG TABS Take by mouth.      Marland Kitchen URELLE (URELLE/URISED) 81 MG TABS tablet Take 1 tablet by mouth 4 (four) times daily.       No current facility-administered medications for this encounter.     REVIEW OF SYSTEMS:  Pertinent items are noted in HPI.    PHYSICAL EXAM:  height is 5\' 8"  (1.727 m) and weight is 185 lb 1.6 oz (83.961 kg). His oral temperature is 98.3 F (36.8 C). His blood pressure is 143/82 and his pulse is 65. His respiration is 20.   Alert and oriented 68 year old black male appearing his stated age. Head and neck examination: Grossly unremarkable. Nodes: Without palpable cervical or supraclavicular lymphadenopathy. Chest: Lungs clear. Back: Without spinal or CVA tenderness. Abdomen: Without masses organomegaly. Genitalia: Unremarkable to inspection. Rectal: The prostate gland is markedly enlarged and is without focal induration or nodularity. Extremities: Without edema.   LABORATORY DATA:  Lab Results  Component Value Date   WBC 5.2 03/29/2010   HGB 15.0  11/05/2010   HCT 44.0 11/05/2010   MCV 95.9 03/29/2010   PLT 164.0 03/29/2010   Lab Results  Component Value Date   NA 139 11/05/2010   K 4.3 11/05/2010   CL 106 11/05/2010   CO2 27 03/29/2010   Lab Results  Component Value Date   ALT 30 03/29/2010   AST 27 03/29/2010   ALKPHOS 64 03/29/2010   BILITOT 0.6 03/29/2010   PSA 6.55 from 12/30/2013.   IMPRESSION: Stage TI C. favorable risk adenocarcinoma prostate. I explained to the patient that his prognosis is related to his stage, Gleason score, and PSA level. All are favorable. We discussed surgery versus close surveillance/observation versus radiation  therapy. Radiation therapy options include external beam/IMRT, but not seed implantation implantation because of his gland size. There is a remote possibility that we could get his gland small enough with downsizing for seed implantation, but this would require 3-6 months of androgen deprivation therapy. We discussed the potential acute and late toxicities of radiation therapy. He would certainly be a candidate for close surveillance/observation, but he would like to have curative treatment at this time. He is not interested in surgery. After lengthy discussion he is most interested in 8 weeks of external beam which I think is a reasonable choice considering his good performance status. Consent is signed today. He understands that we would like to treat him with a comfortably full bladder. We need to have Dr. Janice Norrie placed 3 gold seed markers for image guidance. We will contact his office today.   PLAN: As discussed above.  I spent 60 minutes minutes face to face with the patient and more than 50% of that time was spent in counseling and/or coordination of care.

## 2014-04-23 NOTE — Progress Notes (Signed)
Patient reports that he has itchy patches on his skin at times and that ever since he lived in [place] his lips stay red and sore. Pt applies chapstick to lips, gave him Aquaphor samples to try.

## 2014-05-14 ENCOUNTER — Telehealth: Payer: Self-pay | Admitting: *Deleted

## 2014-05-14 NOTE — Telephone Encounter (Signed)
CALLED PATIENT TO INFORM OF GOLD SEED PLACEMENT ON 06-24-14- ARRIVAL TIME - 1:45 PM @ DR. Janice Norrie' OFFICE AND HIS SIM ON 06-26-14 @ 10 AM @ DR. MURRAY'S OFFICE, SPOKE WITH PATIENT AND HE IS AWARE OF THESE APPTS.

## 2014-06-26 ENCOUNTER — Ambulatory Visit
Admission: RE | Admit: 2014-06-26 | Discharge: 2014-06-26 | Disposition: A | Discharge status: Home / Self Care | Payer: PRIVATE HEALTH INSURANCE | Source: Ambulatory Visit | Attending: Radiation Oncology | Admitting: Radiation Oncology

## 2014-06-26 DIAGNOSIS — C61 Malignant neoplasm of prostate: Secondary | ICD-10-CM

## 2014-06-26 DIAGNOSIS — Z510 Encounter for antineoplastic radiation therapy: Secondary | ICD-10-CM | POA: Diagnosis not present

## 2014-06-26 NOTE — Progress Notes (Signed)
Complex simulation/treatment planning note: The patient was taken to the CT simulator. He was placed supine. A VAC LOC immobilization device was constructed. A red rubber catheter was placed within the rectal vault. He was then catheterized and contrast instilled into the bladder/urethra. The CT data set was sent to the planning system (MIM) when I contoured his prostate, seminal vesicles, bladder, rectum, and rectosigmoid colon. I prescribing 7800 cGy in 40 sessions to his prostate PTV which represents the prostate was 0.8 cm except for 0.5 cm along the rectum. I prescribing 5600 cGy in 40 sessions to his seminal vesicle PTV which represents his seminal vesicles +0.5 cm.

## 2014-06-30 ENCOUNTER — Encounter: Payer: Self-pay | Admitting: Radiation Oncology

## 2014-06-30 DIAGNOSIS — Z510 Encounter for antineoplastic radiation therapy: Secondary | ICD-10-CM | POA: Diagnosis not present

## 2014-06-30 NOTE — Progress Notes (Signed)
IMRT simulation/treatment planning note: The patient completed his IMRT treatment planning today. IMRT was chosen to decrease the risk for both acute and late bladder and rectal toxicity compared to conventional or 3-D conformal radiation therapy. Dose volume histograms were obtained for the target structures including the prostate and seminal vesicles and also the avoidance structures including the bladder, rectum, and femoral heads. We met our departmental goals with the exception of his bladder Margretta Sidle) because of his small bladder volume. This will not be an issue when he has a full bladder during his treatment. I'm prescribing 7800 cGy in 40 sessions to his prostate PTV and 5600 cGy in 40 sessions to his seminal vesicle PTV. He is being treated with dual ARC VMAT IMRT .

## 2014-07-07 ENCOUNTER — Ambulatory Visit
Admission: RE | Admit: 2014-07-07 | Discharge: 2014-07-07 | Disposition: A | Discharge status: Home / Self Care | Payer: PRIVATE HEALTH INSURANCE | Source: Ambulatory Visit | Attending: Radiation Oncology | Admitting: Radiation Oncology

## 2014-07-07 ENCOUNTER — Encounter: Payer: Self-pay | Admitting: Radiation Oncology

## 2014-07-07 VITALS — BP 156/91 | HR 57 | Temp 97.8°F | Resp 20 | Wt 183.1 lb

## 2014-07-07 DIAGNOSIS — C61 Malignant neoplasm of prostate: Secondary | ICD-10-CM

## 2014-07-07 DIAGNOSIS — Z510 Encounter for antineoplastic radiation therapy: Secondary | ICD-10-CM | POA: Diagnosis not present

## 2014-07-07 NOTE — Progress Notes (Signed)
Chart note: The patient began his VMAT IMRT today in the management of his carcinoma the prostate. He is being treated with 2 VMAT arcs with dynamic MLCs representing one set of IMRT treatment devices (23343).

## 2014-07-07 NOTE — Progress Notes (Addendum)
Patient denies pain, urinary/bowel issues, fatigue, loss of appetite. Patient states his elevated BP "may be due to stress". Offered to check his BP later this week. Pt verbalized understanding. Patient education completed with patient. Gave pt "Radiation and You" booklet w/all pertinent information marked and discussed, re: fatigue, rectal discomfort, urinary/bladder irritation/management, nutrition, pain. Pt verbalized understanding.

## 2014-07-07 NOTE — Progress Notes (Signed)
Weekly Management Note:  Site: Prostate Current Dose:  195  cGy Projected Dose: 7800  cGy  Narrative: The patient is seen today for routine under treatment assessment. CBCT/MVCT images/port films were reviewed. The chart was reviewed.   Bladder filling is satisfactory. No new GU or GI difficulties. He had patient education this morning  Physical Examination:  Filed Vitals:   07/07/14 1111  BP: 156/91  Pulse: 57  Temp: 97.8 F (36.6 C)  Resp: 20  .  Weight: 183 lb 1.6 oz (83.054 kg). No change .  Impression: Tolerating radiation therapy well.  Plan: Continue radiation therapy as planned.

## 2014-07-08 ENCOUNTER — Ambulatory Visit
Admission: RE | Admit: 2014-07-08 | Discharge: 2014-07-08 | Disposition: A | Discharge status: Home / Self Care | Payer: PRIVATE HEALTH INSURANCE | Source: Ambulatory Visit | Attending: Radiation Oncology | Admitting: Radiation Oncology

## 2014-07-08 DIAGNOSIS — Z510 Encounter for antineoplastic radiation therapy: Secondary | ICD-10-CM | POA: Diagnosis not present

## 2014-07-09 ENCOUNTER — Ambulatory Visit
Admission: RE | Admit: 2014-07-09 | Discharge: 2014-07-09 | Disposition: A | Discharge status: Home / Self Care | Payer: PRIVATE HEALTH INSURANCE | Source: Ambulatory Visit | Attending: Radiation Oncology | Admitting: Radiation Oncology

## 2014-07-09 DIAGNOSIS — Z510 Encounter for antineoplastic radiation therapy: Secondary | ICD-10-CM | POA: Diagnosis not present

## 2014-07-09 DIAGNOSIS — R300 Dysuria: Secondary | ICD-10-CM | POA: Insufficient documentation

## 2014-07-09 DIAGNOSIS — C61 Malignant neoplasm of prostate: Secondary | ICD-10-CM | POA: Diagnosis not present

## 2014-07-10 ENCOUNTER — Ambulatory Visit
Admission: RE | Admit: 2014-07-10 | Discharge: 2014-07-10 | Disposition: A | Discharge status: Home / Self Care | Payer: PRIVATE HEALTH INSURANCE | Source: Ambulatory Visit | Attending: Radiation Oncology | Admitting: Radiation Oncology

## 2014-07-10 DIAGNOSIS — Z510 Encounter for antineoplastic radiation therapy: Secondary | ICD-10-CM | POA: Diagnosis not present

## 2014-07-11 ENCOUNTER — Ambulatory Visit
Admission: RE | Admit: 2014-07-11 | Discharge: 2014-07-11 | Disposition: A | Discharge status: Home / Self Care | Payer: PRIVATE HEALTH INSURANCE | Source: Ambulatory Visit | Attending: Radiation Oncology | Admitting: Radiation Oncology

## 2014-07-11 DIAGNOSIS — Z510 Encounter for antineoplastic radiation therapy: Secondary | ICD-10-CM | POA: Diagnosis not present

## 2014-07-15 ENCOUNTER — Ambulatory Visit
Admission: RE | Admit: 2014-07-15 | Discharge: 2014-07-15 | Disposition: A | Discharge status: Home / Self Care | Payer: PRIVATE HEALTH INSURANCE | Source: Ambulatory Visit | Attending: Radiation Oncology | Admitting: Radiation Oncology

## 2014-07-15 VITALS — BP 141/75 | HR 63 | Temp 97.7°F | Resp 20 | Wt 182.5 lb

## 2014-07-15 DIAGNOSIS — R300 Dysuria: Secondary | ICD-10-CM

## 2014-07-15 DIAGNOSIS — C61 Malignant neoplasm of prostate: Secondary | ICD-10-CM

## 2014-07-15 DIAGNOSIS — Z510 Encounter for antineoplastic radiation therapy: Secondary | ICD-10-CM | POA: Diagnosis not present

## 2014-07-15 NOTE — Progress Notes (Signed)
Patient states he began last week having "pain" when he urinates. He denies burning but states "it is pain". He denies urinary odor, cloudy urine and dark urine. He states he drinks "lots of fluids". Pt denies other urinary issues, denies bowel issues, fatigue, loss of appetite. He reports hot flashes, "sweating".

## 2014-07-15 NOTE — Progress Notes (Signed)
Weekly Management Note:  Site: Prostate Current Dose:  1170  cGy Projected Dose: 7800  cGy  Narrative: The patient is seen today for routine under treatment assessment. CBCT/MVCT images/port films were reviewed. The chart was reviewed.   Bladder filling is satisfactory. He is having more dysuria which is not for a common for this stage of his radiation therapy. No GI difficulties.  Physical Examination:  Filed Vitals:   07/15/14 0911  BP: 141/75  Pulse: 63  Temp: 97.7 F (36.5 C)  Resp: 20  .  Weight: 182 lb 8 oz (82.781 kg). No change .  Impression: Tolerating radiation therapy well, except for dysuria. We need to obtain a urinalysis and urine culture.  Plan: Continue radiation therapy as planned.

## 2014-07-16 ENCOUNTER — Ambulatory Visit
Admission: RE | Admit: 2014-07-16 | Discharge: 2014-07-16 | Disposition: A | Discharge status: Home / Self Care | Payer: PRIVATE HEALTH INSURANCE | Source: Ambulatory Visit | Attending: Radiation Oncology | Admitting: Radiation Oncology

## 2014-07-16 DIAGNOSIS — R300 Dysuria: Secondary | ICD-10-CM | POA: Diagnosis not present

## 2014-07-16 DIAGNOSIS — Z510 Encounter for antineoplastic radiation therapy: Secondary | ICD-10-CM | POA: Diagnosis not present

## 2014-07-16 LAB — URINALYSIS, MICROSCOPIC - CHCC
BACTERIA UA: NEGATIVE
BILIRUBIN (URINE): NEGATIVE
GLUCOSE UR CHCC: NEGATIVE mg/dL
KETONES: NEGATIVE mg/dL
Leukocyte Esterase: NEGATIVE
Nitrite: NEGATIVE
Protein: 30 mg/dL
SPECIFIC GRAVITY, URINE: 1.01 (ref 1.003–1.035)
UROBILINOGEN UR: 0.2 mg/dL (ref 0.2–1)
pH: 7 (ref 4.6–8.0)

## 2014-07-17 ENCOUNTER — Ambulatory Visit
Admission: RE | Admit: 2014-07-17 | Discharge: 2014-07-17 | Disposition: A | Discharge status: Home / Self Care | Payer: PRIVATE HEALTH INSURANCE | Source: Ambulatory Visit | Attending: Radiation Oncology | Admitting: Radiation Oncology

## 2014-07-17 DIAGNOSIS — Z510 Encounter for antineoplastic radiation therapy: Secondary | ICD-10-CM | POA: Diagnosis not present

## 2014-07-17 LAB — URINE CULTURE

## 2014-07-18 ENCOUNTER — Encounter: Payer: Self-pay | Admitting: Radiation Oncology

## 2014-07-18 ENCOUNTER — Ambulatory Visit
Admission: RE | Admit: 2014-07-18 | Discharge: 2014-07-18 | Disposition: A | Discharge status: Home / Self Care | Payer: PRIVATE HEALTH INSURANCE | Source: Ambulatory Visit | Attending: Radiation Oncology | Admitting: Radiation Oncology

## 2014-07-18 DIAGNOSIS — Z510 Encounter for antineoplastic radiation therapy: Secondary | ICD-10-CM | POA: Diagnosis not present

## 2014-07-18 NOTE — Progress Notes (Signed)
Patient brought to nursing after radiation treatment. He states he has increased pain with urination especially at the end of voiding. He states he has pain with ejaculation as well. Pt denies other urinary issues re: burning, increased frequency, dark colored urine, foul odor to urine. He also states he is having small frequent BM's. Dr Valere Dross in to see patient.

## 2014-07-18 NOTE — Progress Notes (Signed)
CC: Dr. Lowella Bandy  Marvin Parker is currently undergoing external beam/IMRT in the management of his carcinoma the prostate. He is into the second week of radiation therapy. Earlier this week, he reported dysuria. We obtained a urine for UA/C&S and both were negative except for trace blood. He is seen again this morning stating that he had worsening of his dysuria localized to his mid to distal penile urethra. His symptoms worsened during sexual activity yesterday, but in retrospect he feels that he's had some degree of dysuria since he was catheterized approximately one week before beginning his radiation therapy (3-4 weeks ago). No history of kidney stones. He has taken Tylenol without improvement.  Genital examination is unremarkable. He describes discomfort localized his mid to distal the penis.  Impression: I cannot explain his dysuria except for the possibility that he may been traumatized the time of his catheterization 3-4 weeks ago. He is not having hematuria and there is no evidence for infection.  Plan: I asked the patient to contact Dr. Janice Norrie for further evaluation.

## 2014-07-21 ENCOUNTER — Ambulatory Visit
Admission: RE | Admit: 2014-07-21 | Discharge: 2014-07-21 | Disposition: A | Discharge status: Home / Self Care | Payer: PRIVATE HEALTH INSURANCE | Source: Ambulatory Visit | Attending: Radiation Oncology | Admitting: Radiation Oncology

## 2014-07-21 ENCOUNTER — Encounter: Payer: Self-pay | Admitting: Radiation Oncology

## 2014-07-21 VITALS — BP 150/84 | HR 64 | Temp 97.5°F | Resp 20 | Wt 186.3 lb

## 2014-07-21 DIAGNOSIS — C61 Malignant neoplasm of prostate: Secondary | ICD-10-CM

## 2014-07-21 DIAGNOSIS — Z510 Encounter for antineoplastic radiation therapy: Secondary | ICD-10-CM | POA: Diagnosis not present

## 2014-07-21 NOTE — Progress Notes (Signed)
Weekly Management Note:  Site: Prostate Current Dose:  1950  cGy Projected Dose: 7800  cGy  Narrative: The patient is seen today for routine under treatment assessment. CBCT/MVCT images/port films were reviewed. The chart was reviewed.   Bladder filling is satisfactory, but I believe could be better. No new GU or GI difficulty. He is having less dysuria while taking ibuprofen.. Dr. Janice Norrie called in a prescription for Pyridium but he will start this later today.  Physical Examination:  Filed Vitals:   07/21/14 0908  BP: 150/84  Pulse: 64  Temp: 97.5 F (36.4 C)  Resp: 20  .  Weight: 186 lb 4.8 oz (84.505 kg). No change.  Impression: Tolerating radiation therapy well.  Plan: Continue radiation therapy as planned.

## 2014-07-21 NOTE — Progress Notes (Signed)
Patient states Dr Janice Norrie called a prescription in for him; he will pick up today. Patient states he was prescribed Pyridium, dose unknown. Informed pt to expect this medication to color his urine orange. Pt states he continues to have pain with urination but denies other pain. He denies fatigue, loss of appetite. He states yesterday he had frequent soft stools and took [employer] with relief.

## 2014-07-22 ENCOUNTER — Ambulatory Visit
Admission: RE | Admit: 2014-07-22 | Discharge: 2014-07-22 | Disposition: A | Discharge status: Home / Self Care | Payer: PRIVATE HEALTH INSURANCE | Source: Ambulatory Visit | Attending: Radiation Oncology | Admitting: Radiation Oncology

## 2014-07-22 DIAGNOSIS — Z510 Encounter for antineoplastic radiation therapy: Secondary | ICD-10-CM | POA: Diagnosis not present

## 2014-07-23 ENCOUNTER — Ambulatory Visit
Admission: RE | Admit: 2014-07-23 | Discharge: 2014-07-23 | Disposition: A | Discharge status: Home / Self Care | Payer: PRIVATE HEALTH INSURANCE | Source: Ambulatory Visit | Attending: Radiation Oncology | Admitting: Radiation Oncology

## 2014-07-23 DIAGNOSIS — Z510 Encounter for antineoplastic radiation therapy: Secondary | ICD-10-CM | POA: Diagnosis not present

## 2014-07-24 ENCOUNTER — Ambulatory Visit
Admission: RE | Admit: 2014-07-24 | Discharge: 2014-07-24 | Disposition: A | Discharge status: Home / Self Care | Payer: PRIVATE HEALTH INSURANCE | Source: Ambulatory Visit | Attending: Radiation Oncology | Admitting: Radiation Oncology

## 2014-07-24 DIAGNOSIS — Z510 Encounter for antineoplastic radiation therapy: Secondary | ICD-10-CM | POA: Diagnosis not present

## 2014-07-25 ENCOUNTER — Ambulatory Visit
Admission: RE | Admit: 2014-07-25 | Discharge: 2014-07-25 | Disposition: A | Discharge status: Home / Self Care | Payer: PRIVATE HEALTH INSURANCE | Source: Ambulatory Visit | Attending: Radiation Oncology | Admitting: Radiation Oncology

## 2014-07-25 DIAGNOSIS — Z510 Encounter for antineoplastic radiation therapy: Secondary | ICD-10-CM | POA: Diagnosis not present

## 2014-07-28 ENCOUNTER — Ambulatory Visit
Admission: RE | Admit: 2014-07-28 | Discharge: 2014-07-28 | Disposition: A | Discharge status: Home / Self Care | Payer: PRIVATE HEALTH INSURANCE | Source: Ambulatory Visit | Attending: Radiation Oncology | Admitting: Radiation Oncology

## 2014-07-28 ENCOUNTER — Encounter: Payer: Self-pay | Admitting: Radiation Oncology

## 2014-07-28 VITALS — BP 142/93 | HR 59 | Temp 97.8°F | Resp 20 | Wt 184.3 lb

## 2014-07-28 DIAGNOSIS — Z510 Encounter for antineoplastic radiation therapy: Secondary | ICD-10-CM | POA: Diagnosis not present

## 2014-07-28 DIAGNOSIS — C61 Malignant neoplasm of prostate: Secondary | ICD-10-CM

## 2014-07-28 NOTE — Progress Notes (Signed)
Patient denies pain, fatigue, loss of appetite. He states he continues to have dysuria, describes urinary pressure when he voids, increased frequency and nocturia x 3. He states Pyridium was not helpful. He states his urine is clear yellow. He contacted Dr Sammie Bench office last week and was given samples of Uribel. He is taking Tylenol prn. He states this morning he feels "maybe a little bit better". He states he also "feels pressure" with BMs but denies constipation, diarrhea.

## 2014-07-28 NOTE — Progress Notes (Signed)
CC: Dr. Lowella Bandy   Weekly Management Note:  Site: Prostate Current Dose:  2925  cGy Projected Dose: 7800  cGy  Narrative: The patient is seen today for routine under treatment assessment. CBCT/MVCT images/port films were reviewed. The chart was reviewed.   He has completed his third week of a scheduled 8 weeks of radiation therapy. He was seen by one of the [facility] nurse practitioners last week and was started on Uribel. He tells me he has 4 more tablets. His urinalysis and culture early last week were both negative. She is somewhat improved today but is still symptomatic. He describes a "pressure" sensation near completion of urination. His bladder filling has been satisfactory, but less than ideal. No GI difficulty.  Physical Examination:  Filed Vitals:   07/28/14 0859  BP: 142/93  Pulse: 59  Temp: 97.8 F (36.6 C)  Resp: 20  .  Weight: 184 lb 4.8 oz (83.598 kg). No change.  Impression: Tolerating radiation therapy well except for continued dysuria, and perhaps spasms. He is symptoms are minimally improved on Uribel, and I'm surprised that he is so symptomatic this early in his course of radiation therapy. I wonder if his urethra was traumatized at the time of his catheterization during his treatment planning over one month ago. He will contact Dr. Janice Norrie later this week if he is not improve.  Plan: Continue radiation therapy as planned.

## 2014-07-29 ENCOUNTER — Ambulatory Visit
Admission: RE | Admit: 2014-07-29 | Discharge: 2014-07-29 | Disposition: A | Discharge status: Home / Self Care | Payer: PRIVATE HEALTH INSURANCE | Source: Ambulatory Visit | Attending: Radiation Oncology | Admitting: Radiation Oncology

## 2014-07-29 DIAGNOSIS — Z510 Encounter for antineoplastic radiation therapy: Secondary | ICD-10-CM | POA: Diagnosis not present

## 2014-07-30 ENCOUNTER — Ambulatory Visit
Admission: RE | Admit: 2014-07-30 | Discharge: 2014-07-30 | Disposition: A | Discharge status: Home / Self Care | Payer: PRIVATE HEALTH INSURANCE | Source: Ambulatory Visit | Attending: Radiation Oncology | Admitting: Radiation Oncology

## 2014-07-30 DIAGNOSIS — Z510 Encounter for antineoplastic radiation therapy: Secondary | ICD-10-CM | POA: Diagnosis not present

## 2014-07-31 ENCOUNTER — Ambulatory Visit
Admission: RE | Admit: 2014-07-31 | Discharge: 2014-07-31 | Disposition: A | Discharge status: Home / Self Care | Payer: PRIVATE HEALTH INSURANCE | Source: Ambulatory Visit | Attending: Radiation Oncology | Admitting: Radiation Oncology

## 2014-07-31 DIAGNOSIS — Z510 Encounter for antineoplastic radiation therapy: Secondary | ICD-10-CM | POA: Diagnosis not present

## 2014-08-01 ENCOUNTER — Ambulatory Visit
Admission: RE | Admit: 2014-08-01 | Discharge: 2014-08-01 | Disposition: A | Discharge status: Home / Self Care | Payer: PRIVATE HEALTH INSURANCE | Source: Ambulatory Visit | Attending: Radiation Oncology | Admitting: Radiation Oncology

## 2014-08-01 DIAGNOSIS — Z510 Encounter for antineoplastic radiation therapy: Secondary | ICD-10-CM | POA: Diagnosis not present

## 2014-08-04 ENCOUNTER — Encounter: Payer: Self-pay | Admitting: Radiation Oncology

## 2014-08-04 ENCOUNTER — Ambulatory Visit
Admission: RE | Admit: 2014-08-04 | Discharge: 2014-08-04 | Disposition: A | Discharge status: Home / Self Care | Payer: PRIVATE HEALTH INSURANCE | Source: Ambulatory Visit | Attending: Radiation Oncology | Admitting: Radiation Oncology

## 2014-08-04 VITALS — BP 141/76 | HR 58 | Temp 97.7°F | Resp 20 | Wt 185.0 lb

## 2014-08-04 DIAGNOSIS — Z510 Encounter for antineoplastic radiation therapy: Secondary | ICD-10-CM | POA: Diagnosis not present

## 2014-08-04 DIAGNOSIS — C61 Malignant neoplasm of prostate: Secondary | ICD-10-CM

## 2014-08-04 NOTE — Progress Notes (Signed)
Weekly Management Note:  Site: Prostate Current Dose:  3900  cGy Projected Dose: 7800  cGy  Narrative: The patient is seen today for routine under treatment assessment. CBCT/MVCT images/port films were reviewed. The chart was reviewed.   Bladder filling is less than ideal. He did see Dr. Janice Norrie last week he felt that his symptomatology he was secondary to his radiation therapy. He was started on VESIcare in his symptoms are somewhat improved.  Physical Examination:  Filed Vitals:   08/04/14 0844  BP: 141/76  Pulse: 58  Temp: 97.7 F (36.5 C)  Resp: 20  .  Weight: 185 lb (83.915 kg). No change.  Impression: Tolerating radiation therapy well. I instructed the patient to improve his bladder filling to decrease urinary toxicity.  Plan: Continue radiation therapy as planned.

## 2014-08-04 NOTE — Progress Notes (Signed)
Patient states his urinary issues "are the same". He states some days "are better than others". He denies fatigue, bowel issues, loss of appetite.

## 2014-08-05 ENCOUNTER — Ambulatory Visit
Admission: RE | Admit: 2014-08-05 | Discharge: 2014-08-05 | Disposition: A | Discharge status: Home / Self Care | Payer: PRIVATE HEALTH INSURANCE | Source: Ambulatory Visit | Attending: Radiation Oncology | Admitting: Radiation Oncology

## 2014-08-05 DIAGNOSIS — Z510 Encounter for antineoplastic radiation therapy: Secondary | ICD-10-CM | POA: Diagnosis not present

## 2014-08-06 ENCOUNTER — Ambulatory Visit
Admission: RE | Admit: 2014-08-06 | Discharge: 2014-08-06 | Disposition: A | Discharge status: Home / Self Care | Payer: PRIVATE HEALTH INSURANCE | Source: Ambulatory Visit | Attending: Radiation Oncology | Admitting: Radiation Oncology

## 2014-08-06 DIAGNOSIS — Z510 Encounter for antineoplastic radiation therapy: Secondary | ICD-10-CM | POA: Diagnosis not present

## 2014-08-07 ENCOUNTER — Ambulatory Visit
Admission: RE | Admit: 2014-08-07 | Discharge: 2014-08-07 | Disposition: A | Discharge status: Home / Self Care | Payer: No Typology Code available for payment source | Source: Ambulatory Visit | Attending: Radiation Oncology | Admitting: Radiation Oncology

## 2014-08-07 DIAGNOSIS — C61 Malignant neoplasm of prostate: Secondary | ICD-10-CM | POA: Insufficient documentation

## 2014-08-07 DIAGNOSIS — Z510 Encounter for antineoplastic radiation therapy: Secondary | ICD-10-CM | POA: Diagnosis present

## 2014-08-08 ENCOUNTER — Ambulatory Visit
Admission: RE | Admit: 2014-08-08 | Discharge: 2014-08-08 | Disposition: A | Discharge status: Home / Self Care | Payer: No Typology Code available for payment source | Source: Ambulatory Visit | Attending: Radiation Oncology | Admitting: Radiation Oncology

## 2014-08-08 DIAGNOSIS — Z510 Encounter for antineoplastic radiation therapy: Secondary | ICD-10-CM | POA: Diagnosis not present

## 2014-08-11 ENCOUNTER — Encounter: Payer: Self-pay | Admitting: Radiation Oncology

## 2014-08-11 ENCOUNTER — Ambulatory Visit
Admission: RE | Admit: 2014-08-11 | Discharge: 2014-08-11 | Disposition: A | Discharge status: Home / Self Care | Payer: No Typology Code available for payment source | Source: Ambulatory Visit | Attending: Radiation Oncology | Admitting: Radiation Oncology

## 2014-08-11 VITALS — BP 141/90 | HR 60 | Temp 97.7°F | Resp 20 | Wt 184.5 lb

## 2014-08-11 DIAGNOSIS — Z510 Encounter for antineoplastic radiation therapy: Secondary | ICD-10-CM | POA: Diagnosis not present

## 2014-08-11 DIAGNOSIS — C61 Malignant neoplasm of prostate: Secondary | ICD-10-CM

## 2014-08-11 NOTE — Progress Notes (Signed)
Weekly Management Note:  Site: Prostate Current Dose:  4875  cGy Projected Dose: 7800  cGy  Narrative: The patient is seen today for routine under treatment assessment. CBCT/MVCT images/port films were reviewed. The chart was reviewed.   Bladder filling is excellent today. He continues with his VESIcare. He still has a mild discomfort with urination, but he is having less urinary frequency. He has occasional constipation which may be secondary to his VESIcare. He may take a stool softener.  Physical Examination:  Filed Vitals:   08/11/14 0854  BP: 141/90  Pulse: 60  Temp: 97.7 F (36.5 C)  Resp: 20  .  Weight: 184 lb 8 oz (83.689 kg). No change.  Impression: Tolerating radiation therapy well.  Plan: Continue radiation therapy as planned.

## 2014-08-11 NOTE — Progress Notes (Signed)
Patient states he continues to have "the same pain with urination". He states Vesicare does not seem very helpful. He states his urine is clear yellow, states occasionally his stools are "hard, but not a problem".

## 2014-08-12 ENCOUNTER — Ambulatory Visit
Admission: RE | Admit: 2014-08-12 | Discharge: 2014-08-12 | Disposition: A | Discharge status: Home / Self Care | Payer: No Typology Code available for payment source | Source: Ambulatory Visit | Attending: Radiation Oncology | Admitting: Radiation Oncology

## 2014-08-12 DIAGNOSIS — Z510 Encounter for antineoplastic radiation therapy: Secondary | ICD-10-CM | POA: Diagnosis not present

## 2014-08-13 ENCOUNTER — Ambulatory Visit
Admission: RE | Admit: 2014-08-13 | Discharge: 2014-08-13 | Disposition: A | Discharge status: Home / Self Care | Payer: No Typology Code available for payment source | Source: Ambulatory Visit | Attending: Radiation Oncology | Admitting: Radiation Oncology

## 2014-08-13 DIAGNOSIS — Z510 Encounter for antineoplastic radiation therapy: Secondary | ICD-10-CM | POA: Diagnosis not present

## 2014-08-14 ENCOUNTER — Ambulatory Visit
Admission: RE | Admit: 2014-08-14 | Discharge: 2014-08-14 | Disposition: A | Discharge status: Home / Self Care | Payer: No Typology Code available for payment source | Source: Ambulatory Visit | Attending: Radiation Oncology | Admitting: Radiation Oncology

## 2014-08-14 DIAGNOSIS — Z510 Encounter for antineoplastic radiation therapy: Secondary | ICD-10-CM | POA: Diagnosis not present

## 2014-08-15 ENCOUNTER — Ambulatory Visit
Admission: RE | Admit: 2014-08-15 | Discharge: 2014-08-15 | Disposition: A | Discharge status: Home / Self Care | Payer: No Typology Code available for payment source | Source: Ambulatory Visit | Attending: Radiation Oncology | Admitting: Radiation Oncology

## 2014-08-15 DIAGNOSIS — Z510 Encounter for antineoplastic radiation therapy: Secondary | ICD-10-CM | POA: Diagnosis not present

## 2014-08-18 ENCOUNTER — Ambulatory Visit
Admission: RE | Admit: 2014-08-18 | Discharge: 2014-08-18 | Disposition: A | Discharge status: Home / Self Care | Payer: Medicare Other | Source: Ambulatory Visit | Attending: Radiation Oncology | Admitting: Radiation Oncology

## 2014-08-18 ENCOUNTER — Encounter: Payer: Self-pay | Admitting: Radiation Oncology

## 2014-08-18 ENCOUNTER — Ambulatory Visit
Admission: RE | Admit: 2014-08-18 | Discharge: 2014-08-18 | Disposition: A | Discharge status: Home / Self Care | Payer: No Typology Code available for payment source | Source: Ambulatory Visit | Attending: Radiation Oncology | Admitting: Radiation Oncology

## 2014-08-18 VITALS — BP 166/83 | HR 57 | Temp 97.7°F | Resp 20 | Wt 187.9 lb

## 2014-08-18 DIAGNOSIS — C61 Malignant neoplasm of prostate: Secondary | ICD-10-CM

## 2014-08-18 DIAGNOSIS — Z510 Encounter for antineoplastic radiation therapy: Secondary | ICD-10-CM | POA: Diagnosis not present

## 2014-08-18 NOTE — Progress Notes (Signed)
Weekly Management Note:  Site: Prostate Current Dose:  5850  cGy Projected Dose: 7800  cGy  Narrative: The patient is seen today for routine under treatment assessment. CBCT/MVCT images/port films were reviewed. The chart was reviewed.   He has nocturia x 0-5.  Bladder filling is satisfactory. No change in his penile urethral discomfort. He continues with his VESIcare. Pyridium is not very helpful.  Physical Examination:  Filed Vitals:   08/18/14 0900  BP: 166/83  Pulse: 57  Temp: 97.7 F (36.5 C)  Resp: 20  .  Weight: 187 lb 14.4 oz (85.231 kg). No change.  Impression: Tolerating radiation therapy well.  Plan: Continue radiation therapy as planned.

## 2014-08-18 NOTE — Progress Notes (Signed)
Patient denies pain, fatigue, loss of appetite, bowel issues. He states he continues to have "the same urinary issues", burning and pain with urination. He states that he had had nocturia x 4-5 but , he states he "didn't get up all night last night". He states he does not feel Pyridium has been helpful.

## 2014-08-19 ENCOUNTER — Ambulatory Visit
Admission: RE | Admit: 2014-08-19 | Discharge: 2014-08-19 | Disposition: A | Discharge status: Home / Self Care | Payer: No Typology Code available for payment source | Source: Ambulatory Visit | Attending: Radiation Oncology | Admitting: Radiation Oncology

## 2014-08-19 DIAGNOSIS — Z510 Encounter for antineoplastic radiation therapy: Secondary | ICD-10-CM | POA: Diagnosis not present

## 2014-08-20 ENCOUNTER — Ambulatory Visit
Admission: RE | Admit: 2014-08-20 | Discharge: 2014-08-20 | Disposition: A | Discharge status: Home / Self Care | Payer: No Typology Code available for payment source | Source: Ambulatory Visit | Attending: Radiation Oncology | Admitting: Radiation Oncology

## 2014-08-20 ENCOUNTER — Encounter: Payer: Self-pay | Admitting: Radiation Oncology

## 2014-08-20 DIAGNOSIS — Z510 Encounter for antineoplastic radiation therapy: Secondary | ICD-10-CM | POA: Diagnosis not present

## 2014-08-20 NOTE — Progress Notes (Signed)
Chart note: On review of the patient's cone beam CT, I noted that the prostate contour overlapped the rectum along the inferior aspect of the prostate. I reviewed my contouring in [facility] and the planned contour is not what I contoured for the patient. This is being investigated by physics. In any case, he was replanned yesterday to adjust the dose to his rectum with final dose volume histograms meeting the RTOG criteria. Since he does have favorable risk disease I will limit his prescription dose to 7605 cGy in 39 sessions.

## 2014-08-21 ENCOUNTER — Ambulatory Visit
Admission: RE | Admit: 2014-08-21 | Discharge: 2014-08-21 | Disposition: A | Discharge status: Home / Self Care | Payer: No Typology Code available for payment source | Source: Ambulatory Visit | Attending: Radiation Oncology | Admitting: Radiation Oncology

## 2014-08-21 DIAGNOSIS — Z510 Encounter for antineoplastic radiation therapy: Secondary | ICD-10-CM | POA: Diagnosis not present

## 2014-08-22 ENCOUNTER — Ambulatory Visit
Admission: RE | Admit: 2014-08-22 | Discharge: 2014-08-22 | Disposition: A | Discharge status: Home / Self Care | Payer: No Typology Code available for payment source | Source: Ambulatory Visit | Attending: Radiation Oncology | Admitting: Radiation Oncology

## 2014-08-22 DIAGNOSIS — Z510 Encounter for antineoplastic radiation therapy: Secondary | ICD-10-CM | POA: Diagnosis not present

## 2014-08-25 ENCOUNTER — Ambulatory Visit
Admission: RE | Admit: 2014-08-25 | Discharge: 2014-08-25 | Disposition: A | Discharge status: Home / Self Care | Payer: No Typology Code available for payment source | Source: Ambulatory Visit | Attending: Radiation Oncology | Admitting: Radiation Oncology

## 2014-08-25 ENCOUNTER — Ambulatory Visit
Admission: RE | Admit: 2014-08-25 | Discharge: 2014-08-25 | Disposition: A | Discharge status: Home / Self Care | Payer: Medicare Other | Source: Ambulatory Visit | Attending: Radiation Oncology | Admitting: Radiation Oncology

## 2014-08-25 DIAGNOSIS — C61 Malignant neoplasm of prostate: Secondary | ICD-10-CM

## 2014-08-25 DIAGNOSIS — Z510 Encounter for antineoplastic radiation therapy: Secondary | ICD-10-CM | POA: Diagnosis not present

## 2014-08-25 NOTE — Progress Notes (Addendum)
Weekly Management Note:  Site: Prostate Current Dose:  6825  cGy Projected Dose: 7605  cGy  Narrative: The patient is seen today for routine under treatment assessment. CBCT/MVCT images/port films were reviewed. The chart was reviewed.   Bladder filling today is excellent. He still penile urethral discomfort when he urinates. He also reports occasional rectal discomfort when he urinates as well. After re-review of his dosimetry we corrected for his rectal dosing based on contours placed and planned by dosimetry. This was discussed in detail with the patient. Since he does have favorable risk disease, we will stop his treatment at a target dose of 7600 cGy.  Physical Examination: There were no vitals filed for this visit..  Weight:  . No change.  Impression: Tolerating radiation therapy well. I'll see him this Thursday prior to him finishing this Friday.  Plan: Continue radiation therapy as planned.

## 2014-08-25 NOTE — Progress Notes (Signed)
Patient states he continues to have "same pain and pressure with urinating", was worse over the weekend. He states his urine in clear. He states he also has "similar pain" with bowel movements. He denies other urinary/bowel issues, denies fatigue, loss of appetite.

## 2014-08-26 ENCOUNTER — Ambulatory Visit
Admission: RE | Admit: 2014-08-26 | Discharge: 2014-08-26 | Disposition: A | Discharge status: Home / Self Care | Payer: No Typology Code available for payment source | Source: Ambulatory Visit | Attending: Radiation Oncology | Admitting: Radiation Oncology

## 2014-08-26 DIAGNOSIS — Z510 Encounter for antineoplastic radiation therapy: Secondary | ICD-10-CM | POA: Diagnosis not present

## 2014-08-27 ENCOUNTER — Ambulatory Visit
Admission: RE | Admit: 2014-08-27 | Discharge: 2014-08-27 | Disposition: A | Discharge status: Home / Self Care | Payer: No Typology Code available for payment source | Source: Ambulatory Visit | Attending: Radiation Oncology | Admitting: Radiation Oncology

## 2014-08-27 DIAGNOSIS — Z510 Encounter for antineoplastic radiation therapy: Secondary | ICD-10-CM | POA: Diagnosis not present

## 2014-08-28 ENCOUNTER — Ambulatory Visit
Admission: RE | Admit: 2014-08-28 | Discharge: 2014-08-28 | Disposition: A | Discharge status: Home / Self Care | Payer: No Typology Code available for payment source | Source: Ambulatory Visit | Attending: Radiation Oncology | Admitting: Radiation Oncology

## 2014-08-28 ENCOUNTER — Inpatient Hospital Stay
Admission: RE | Admit: 2014-08-28 | Discharge: 2014-08-28 | Disposition: A | Discharge status: Home / Self Care | Payer: Self-pay | Source: Ambulatory Visit | Attending: Radiation Oncology | Admitting: Radiation Oncology

## 2014-08-28 ENCOUNTER — Ambulatory Visit: Payer: PRIVATE HEALTH INSURANCE

## 2014-08-28 DIAGNOSIS — C61 Malignant neoplasm of prostate: Secondary | ICD-10-CM

## 2014-08-28 DIAGNOSIS — Z510 Encounter for antineoplastic radiation therapy: Secondary | ICD-10-CM | POA: Diagnosis not present

## 2014-08-28 NOTE — Progress Notes (Signed)
Weekly Management Note:  Site: Prostate Current Dose:  7410  cGy Projected Dose: 7605  cGy  Narrative: The patient is seen today for routine under treatment assessment. CBCT/MVCT images/port films were reviewed. The chart was reviewed.   Bladder filling is satisfactory. No new GU or GI difficulties.  Physical Examination: There were no vitals filed for this visit..  Weight:  . No change .  Impression: Tolerating radiation therapy well. He will finish his radiation therapy tomorrow.  Plan: Continue radiation therapy as planned.

## 2014-08-29 ENCOUNTER — Ambulatory Visit
Admission: RE | Admit: 2014-08-29 | Discharge: 2014-08-29 | Disposition: A | Discharge status: Home / Self Care | Payer: No Typology Code available for payment source | Source: Ambulatory Visit | Attending: Radiation Oncology | Admitting: Radiation Oncology

## 2014-08-29 DIAGNOSIS — Z510 Encounter for antineoplastic radiation therapy: Secondary | ICD-10-CM | POA: Diagnosis not present

## 2014-08-31 ENCOUNTER — Encounter: Payer: Self-pay | Admitting: Radiation Oncology

## 2014-08-31 NOTE — Progress Notes (Signed)
[facility] End of Treatment Note  Name:Marvin Parker  Date: 08/31/2014 FYB:[id number] DOB:05-18-1946   Status:outpatient    CC: [facility], [place]  Dr. Lowella Bandy  REFERRING PHYSICIAN: Dr. 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 This began during his second week of radiation therapy. He was given a trial of Pyridium, Uribel, and also VESIcare without much benefit. Urine cultures remain negative. No GI difficulties during his course of therapy.                         PLAN: Routine followup in one month. Patient instructed to call if questions or worsening complaints in interim.

## 2014-09-01 ENCOUNTER — Ambulatory Visit: Payer: No Typology Code available for payment source

## 2014-09-29 ENCOUNTER — Ambulatory Visit
Admission: RE | Admit: 2014-09-29 | Discharge: 2014-09-29 | Disposition: A | Discharge status: Home / Self Care | Payer: Medicare Other | Source: Ambulatory Visit | Attending: Radiation Oncology | Admitting: Radiation Oncology

## 2014-09-29 ENCOUNTER — Encounter: Payer: Self-pay | Admitting: *Deleted

## 2014-09-29 VITALS — BP 144/80 | HR 64 | Temp 97.8°F | Resp 20 | Wt 188.6 lb

## 2014-09-29 DIAGNOSIS — C61 Malignant neoplasm of prostate: Secondary | ICD-10-CM

## 2014-09-29 HISTORY — DX: Personal history of irradiation: Z92.3

## 2014-09-29 NOTE — Progress Notes (Signed)
Patient states he had urinary urgency, saw Dr Janice Norrie and was put on Toviaz. He states "after one dose I was better". He denies pain except chronic left knee pain, denies urinary/bowel issues, fatigue, loss of appetite.

## 2014-09-29 NOTE — Progress Notes (Signed)
CC: Dr. Lowella Bandy  Follow-up note:  Marvin Parker returns today approximately 1 month following completion of external beam/IMRT in the management of his stage TIc favorable risk adenocarcinoma prostate.  He was recently seen by Dr. Janice Norrie who started him on [place] for his urinary urgency/spasms.  He states that his discomfort was better after the first dose.  Otherwise he is having no GU or GI difficulties.  He remains in good spirits.  He tells me he'll see Dr. Janice Norrie back for a follow-up visit, and PSA determination in approximately 2 months.  His examination: Alert and oriented. Filed Vitals:   09/29/14 0848  BP: 144/80  Pulse: 64  Temp: 97.8 F (36.6 C)  Resp: 20   Rectal examination not performed today.  Impression: Satisfactory progress with improvement of his bladder spasm on Toviaz.  I told Marvin Parker that this should be a short-lived a problem.  Plan: I've not scheduled the patient for a formal follow-up visit and I ask that Dr. Janice Norrie keep me posted on his progress.

## 2016-02-07 ENCOUNTER — Encounter (HOSPITAL_COMMUNITY): Payer: Self-pay

## 2016-02-07 ENCOUNTER — Emergency Department (HOSPITAL_COMMUNITY)
Admission: EM | Admit: 2016-02-07 | Discharge: 2016-02-08 | Disposition: A | Discharge status: Home / Self Care | Payer: Medicare Other | Attending: Emergency Medicine | Admitting: Emergency Medicine

## 2016-02-07 DIAGNOSIS — Z923 Personal history of irradiation: Secondary | ICD-10-CM | POA: Insufficient documentation

## 2016-02-07 DIAGNOSIS — R350 Frequency of micturition: Secondary | ICD-10-CM | POA: Insufficient documentation

## 2016-02-07 DIAGNOSIS — R300 Dysuria: Secondary | ICD-10-CM | POA: Insufficient documentation

## 2016-02-07 DIAGNOSIS — Z79899 Other long term (current) drug therapy: Secondary | ICD-10-CM | POA: Insufficient documentation

## 2016-02-07 DIAGNOSIS — N4889 Other specified disorders of penis: Secondary | ICD-10-CM | POA: Insufficient documentation

## 2016-02-07 DIAGNOSIS — Z8546 Personal history of malignant neoplasm of prostate: Secondary | ICD-10-CM | POA: Insufficient documentation

## 2016-02-07 LAB — URINALYSIS, ROUTINE W REFLEX MICROSCOPIC
BILIRUBIN URINE: NEGATIVE
Glucose, UA: NEGATIVE mg/dL
HGB URINE DIPSTICK: NEGATIVE
Ketones, ur: NEGATIVE mg/dL
Leukocytes, UA: NEGATIVE
Nitrite: NEGATIVE
PROTEIN: NEGATIVE mg/dL
Specific Gravity, Urine: 1.029 (ref 1.005–1.030)
pH: 5.5 (ref 5.0–8.0)

## 2016-02-07 NOTE — ED Notes (Signed)
Pt unable to provide urine sample at this time. He reports he will let staff know when he can provide a urine sample.

## 2016-02-07 NOTE — ED Provider Notes (Signed)
CSN: [id number]     Arrival date & time 02/07/16  2229 History   By signing my name below, I, Forrestine Him, attest that this documentation has been prepared under the direction and in the presence of Merryl Hacker, MD.  Electronically Signed: Forrestine Him, ED Scribe. 02/08/2016. 12:34 AM.   Chief Complaint  Patient presents with  . Dysuria   The history is provided by the patient. No language interpreter was used.    HPI Comments: Corson Aybar is a 70 y.o. male with an PMHx of prostate cancer who presents to the Emergency Department complaining of constant, ongoing dysuria x 2 months. Pt also reports recent urinary frequency and a burning sensation with pressure to penis area. No alleviating factors reported at this time. No OTC medications or home remedies attempted prior to arrival. Pt has been evaluated for same in the past by PCP and was given "some medication." At time of last evaluation pt was sent home with a prescription without any improvement. No recent fever, chills, nausea, or vomiting. He denies any hematuria at this time.  PCP: [facility], MD   UROLOGIST: [facility]   Past Medical History  Diagnosis Date  . 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 History of radiation therapy 07/07/14-08/29/14    prostate 7605 cGy 39 sessions   Past Surgical History  Procedure Laterality Date  . Circumcision    . Prostate biopsy  02/17/14    gleason 3+3=6, 91.34 cc   No family history on file. Social History  Substance Use Topics  . Smoking status: Never Smoker   . Smokeless tobacco: None  . Alcohol Use: No    Review of Systems  Constitutional: Negative for fever and chills.  Respiratory: Negative for shortness of breath.   Cardiovascular: Negative for chest pain.  Gastrointestinal: Negative for nausea, vomiting, abdominal pain and diarrhea.  Genitourinary: Positive for dysuria, frequency and penile pain. Negative for hematuria and flank  pain.  Musculoskeletal: Negative for back pain.  Neurological: Negative for headaches.  Psychiatric/Behavioral: Negative for confusion.  All other systems reviewed and are negative.     Allergies  Review of patient's allergies indicates no known allergies.  Home Medications   Prior to Admission medications   Medication Sig Start Date End Date Taking? Authorizing Provider  acetaminophen (TYLENOL) 100 MG/ML solution Take 10 mg/kg by mouth every 4 (four) hours as needed for fever.    Historical Provider, MD  Avanafil (STENDRA) 100 MG TABS Take by mouth.    Historical Provider, MD  fesoterodine (TOVIAZ) 4 MG TB24 tablet Take 4 mg by mouth daily.    Historical Provider, MD  HYDROcodone-acetaminophen (NORCO/VICODIN) 5-325 MG tablet Take 1 tablet by mouth every 6 (six) hours as needed. 02/08/16   Merryl Hacker, MD  tamsulosin (FLOMAX) 0.4 MG CAPS capsule Take 1 capsule (0.4 mg total) by mouth daily. 02/08/16   Merryl Hacker, MD   Triage Vitals: BP 155/92 mmHg  Pulse 60  Temp(Src) 97.5 F (36.4 C) (Oral)  Resp 17  SpO2 100%   Physical Exam  Constitutional: He is oriented to person, place, and time. He appears well-developed and well-nourished. No distress.  HENT:  Head: Normocephalic and atraumatic.  Cardiovascular: Normal rate, regular rhythm and normal heart sounds.   No murmur heard. Pulmonary/Chest: Effort normal and breath sounds normal. No respiratory distress. He has no wheezes.  Abdominal: Soft. Bowel sounds are normal. There is no tenderness. There is  no rebound.  Genitourinary:  Normal circumcised penis, no penile discharge noted, no external penile lesions, bilateral descended testicles, no crepitus or overlying skin changes  Musculoskeletal: He exhibits no edema.  Neurological: He is alert and oriented to person, place, and time.  Skin: Skin is warm and dry.  Psychiatric: He has a normal mood and affect.  Nursing note and vitals reviewed.   ED Course  Procedures  (including critical care time)  DIAGNOSTIC STUDIES: Oxygen Saturation is 98% on RA, Normal by my interpretation.    COORDINATION OF CARE: 12:21 AM- Will order urinalysis. Discussed treatment plan with pt at bedside and pt agreed to plan.     Labs Review Labs Reviewed  URINE CULTURE  URINALYSIS, ROUTINE W REFLEX MICROSCOPIC (NOT AT [facility])    Imaging Review No results found. I have personally reviewed and evaluated these images and lab results as part of my medical decision-making.   EKG Interpretation None      MDM   Final diagnoses:  Dysuria    Patient presents with dysuria 2 months. Worsening over the last week. Denies any other associated symptoms. History of prostate cancer. Previously followed by Dr. Janice Norrie.  Urinalysis without evidence of urinary tract infection. Urine culture sent. Urojet ordered for the patient and improve patient's symptoms. He may need repeat cystoscopy. Discussed with patient follow-up with urology closely. Will place on Flomax.  After history, exam, and medical workup I feel the patient has been appropriately medically screened and is safe for discharge home. Pertinent diagnoses were discussed with the patient. Patient was given return precautions.  I personally performed the services described in this documentation, which was scribed in my presence. The recorded information has been reviewed and is accurate.   Merryl Hacker, MD 02/08/16 [id number]

## 2016-02-07 NOTE — ED Notes (Signed)
Pt reports dysuria x 2 months and he states that his penis is painful upon palpation. He denies hematuria and denies penile discharge.

## 2016-02-08 MED ORDER — HYDROCODONE-ACETAMINOPHEN 5-325 MG PO TABS: 1.0000 | ORAL_TABLET | Freq: Four times a day (QID) | ORAL | Status: DC | PRN

## 2016-02-08 MED ORDER — LIDOCAINE HCL 2 % EX GEL
1.0000 "application " | Freq: Once | CUTANEOUS | Status: AC
  Administered 2016-02-08: 1 via TOPICAL
  Filled 2016-02-08: qty 20

## 2016-02-08 MED ORDER — TAMSULOSIN HCL 0.4 MG PO CAPS: 0.4000 mg | ORAL_CAPSULE | Freq: Every day | ORAL | Status: DC

## 2016-02-08 NOTE — Discharge Instructions (Signed)

## 2016-02-09 LAB — URINE CULTURE: Culture: NO GROWTH

## 2016-09-12 ENCOUNTER — Encounter (HOSPITAL_COMMUNITY): Payer: Self-pay

## 2016-09-12 ENCOUNTER — Emergency Department (HOSPITAL_COMMUNITY)
Admission: EM | Admit: 2016-09-12 | Discharge: 2016-09-12 | Disposition: A | Discharge status: Home / Self Care | Payer: Medicare Other | Attending: Emergency Medicine | Admitting: Emergency Medicine

## 2016-09-12 DIAGNOSIS — M19011 Primary osteoarthritis, right shoulder: Secondary | ICD-10-CM | POA: Insufficient documentation

## 2016-09-12 DIAGNOSIS — M25512 Pain in left shoulder: Secondary | ICD-10-CM

## 2016-09-12 DIAGNOSIS — M25511 Pain in right shoulder: Secondary | ICD-10-CM

## 2016-09-12 DIAGNOSIS — Z8546 Personal history of malignant neoplasm of prostate: Secondary | ICD-10-CM | POA: Diagnosis not present

## 2016-09-12 DIAGNOSIS — G8929 Other chronic pain: Secondary | ICD-10-CM | POA: Diagnosis not present

## 2016-09-12 DIAGNOSIS — M19012 Primary osteoarthritis, left shoulder: Secondary | ICD-10-CM | POA: Insufficient documentation

## 2016-09-12 DIAGNOSIS — M1990 Unspecified osteoarthritis, unspecified site: Secondary | ICD-10-CM

## 2016-09-12 MED ORDER — TRAMADOL HCL 50 MG PO TABS: 50.0000 mg | ORAL_TABLET | Freq: Four times a day (QID) | ORAL | 0 refills | Status: DC | PRN

## 2016-09-12 MED ORDER — MELOXICAM 7.5 MG PO TABS: 7.5000 mg | ORAL_TABLET | Freq: Every day | ORAL | 0 refills | Status: DC

## 2016-09-12 NOTE — ED Notes (Signed)
MD James at the bedside  

## 2016-09-12 NOTE — ED Notes (Signed)
Pt complaining of bilateral shoulder pain. States that hurts mostly at night.  Able to move both extremities very well and able to lift arms above head with no issue.

## 2016-09-12 NOTE — ED Provider Notes (Signed)
[facility] DEPT Provider Note   CSN: [id number] Arrival date & time: 09/12/16  1034     History   Chief Complaint Chief Complaint  Patient presents with  . Shoulder Pain    HPI Marvin Parker is a 70 y.o. male. He presents reporting 6 months to one year of bilateral shoulder pain. He indicates to me that it is more painful when he internally rotates both shoulders. He works in a [employer]. However, he does mostly desk work. Does not involve a lot of heavy lifting or repetitive motion although he used to be. No fall injury or direct trauma. No other areas of chronic joint pain. No history of gout or other arthritis.  HPI  Past Medical History:  Diagnosis Date  . History of radiation therapy 07/07/14-08/29/14   prostate 7605 cGy 39 sessions  . 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 Dysuria 07/15/2014  . Malignant neoplasm of prostate ([place]) 04/23/2014  . ABDOMINAL PAIN, PERIUMBILIC 0000000    Past Surgical History:  Procedure Laterality Date  . CIRCUMCISION    . PROSTATE BIOPSY  02/17/14   gleason 3+3=6, 91.34 cc       Home Medications    Prior to Admission medications   Medication Sig Start Date End Date Taking? Authorizing Provider  acetaminophen (TYLENOL) 100 MG/ML solution Take 10 mg/kg by mouth every 4 (four) hours as needed for fever.    Historical Provider, MD  Avanafil (STENDRA) 100 MG TABS Take by mouth.    Historical Provider, MD  fesoterodine (TOVIAZ) 4 MG TB24 tablet Take 4 mg by mouth daily.    Historical Provider, MD  HYDROcodone-acetaminophen (NORCO/VICODIN) 5-325 MG tablet Take 1 tablet by mouth every 6 (six) hours as needed. 02/08/16   Merryl Hacker, MD  meloxicam (MOBIC) 7.5 MG tablet Take 1 tablet (7.5 mg total) by mouth daily. 09/12/16   Tanna Furry, MD  tamsulosin (FLOMAX) 0.4 MG CAPS capsule Take 1 capsule (0.4 mg total) by mouth daily. 02/08/16   Merryl Hacker, MD  traMADol (ULTRAM) 50 MG tablet Take 1 tablet (50 mg total) by mouth every 6 (six) hours as needed. 09/12/16   Tanna Furry, MD    Family History History reviewed. No pertinent family history.  Social History Social History  Substance Use Topics  . Smoking status: Never Smoker  . Smokeless tobacco: Never Used  . Alcohol use No     Allergies   Patient has no known allergies.   Review of Systems Review of Systems  Constitutional: Negative for appetite change, chills, diaphoresis, fatigue and fever.  HENT: Negative for mouth sores, sore throat and trouble swallowing.   Eyes: Negative for visual disturbance.  Respiratory: Negative for cough, chest tightness, shortness of breath and wheezing.   Cardiovascular: Negative for chest pain.  Gastrointestinal: Negative for abdominal distention, abdominal pain, diarrhea, nausea and vomiting.  Endocrine: Negative for polydipsia, polyphagia and polyuria.  Genitourinary: Negative for dysuria, frequency and hematuria.  Musculoskeletal: Positive for arthralgias. Negative for gait problem.  Skin: Negative for color change, pallor and rash.  Neurological: Negative for dizziness, syncope, light-headedness and headaches.  Hematological: Does not bruise/bleed easily.  Psychiatric/Behavioral: Negative for behavioral problems and confusion.     Physical Exam Updated Vital Signs BP 136/86 (BP Location: Left Arm)   Pulse 64   Temp 98 F (36.7 C) (Oral)   Resp 17   SpO2  98%   Physical Exam  Constitutional: He is oriented to person, place, and time. He appears well-developed and well-nourished. No distress.  HENT:  Head: Normocephalic.  Eyes: Conjunctivae are normal. Pupils are equal, round, and reactive to light. No scleral icterus.  Neck: Normal range of motion. Neck supple. No thyromegaly present.  Cardiovascular: Normal rate and regular rhythm.  Exam reveals no gallop and no friction rub.   No murmur heard. Pulmonary/Chest:  Effort normal and breath sounds normal. No respiratory distress. He has no wheezes. He has no rales.  Abdominal: Soft. Bowel sounds are normal. He exhibits no distension. There is no tenderness. There is no rebound.  Musculoskeletal: Normal range of motion.  Minimal to no pain with external rotation. He can lift his arm to 90 without a painful arc. He has some pain bilaterally to biceps flexion at the insertion. Pain is mostly along the posterior joint capsule.  Neurological: He is alert and oriented to person, place, and time.  Skin: Skin is warm and dry. No rash noted.  Psychiatric: He has a normal mood and affect. His behavior is normal.     ED Treatments / Results  Labs (all labs ordered are listed, but only abnormal results are displayed) Labs Reviewed - No data to display  EKG  EKG Interpretation None       Radiology No results found.  Procedures Procedures (including critical care time)  Medications Ordered in ED Medications - No data to display   Initial Impression / Assessment and Plan / ED Course  I have reviewed the triage vital signs and the nursing notes.  Pertinent labs & imaging results that were available during my care of the patient were reviewed by me and considered in my medical decision making (see chart for details).  Clinical Course     [place] Mobic. Tramadol at night. Primary care follow-up regarding possible physical therapy referral.  Final Clinical Impressions(s) / ED Diagnoses   Final diagnoses:  Chronic pain of both shoulders  Arthritis    New Prescriptions New Prescriptions   MELOXICAM (MOBIC) 7.5 MG TABLET    Take 1 tablet (7.5 mg total) by mouth daily.   TRAMADOL (ULTRAM) 50 MG TABLET    Take 1 tablet (50 mg total) by mouth every 6 (six) hours as needed.     Tanna Furry, MD 09/12/16 1239

## 2016-09-12 NOTE — ED Triage Notes (Signed)
Pt reports bilateral shoulder pain, worse on the right X3-4 months. Pt reports he does some lifting. Pt reports pain is worse with lying down.

## 2016-09-12 NOTE — Discharge Instructions (Signed)
Take Mobic daily. Tramadol at night if pain keep you awake. If not improving in the next few weeks, contact your primary care physician for appointment.  You may need a physical therapy referral

## 2016-11-14 ENCOUNTER — Ambulatory Visit: Payer: Self-pay | Admitting: Family

## 2016-12-07 DIAGNOSIS — R300 Dysuria: Secondary | ICD-10-CM | POA: Diagnosis not present

## 2016-12-20 ENCOUNTER — Ambulatory Visit (INDEPENDENT_AMBULATORY_CARE_PROVIDER_SITE_OTHER): Payer: Medicare HMO | Admitting: Family

## 2016-12-20 ENCOUNTER — Encounter: Payer: Self-pay | Admitting: Family

## 2016-12-20 ENCOUNTER — Other Ambulatory Visit: Payer: Medicare HMO

## 2016-12-20 VITALS — BP 124/76 | HR 61 | Temp 97.7°F | Resp 16 | Ht 68.0 in | Wt 191.8 lb

## 2016-12-20 DIAGNOSIS — Z23 Encounter for immunization: Secondary | ICD-10-CM

## 2016-12-20 DIAGNOSIS — N529 Male erectile dysfunction, unspecified: Secondary | ICD-10-CM | POA: Diagnosis not present

## 2016-12-20 DIAGNOSIS — R300 Dysuria: Secondary | ICD-10-CM

## 2016-12-20 LAB — POCT URINALYSIS DIPSTICK
Bilirubin, UA: NEGATIVE
Blood, UA: NEGATIVE
Glucose, UA: NEGATIVE
Ketones, UA: NEGATIVE
LEUKOCYTES UA: NEGATIVE
NITRITE UA: NEGATIVE
PH UA: 6
PROTEIN UA: NEGATIVE
Spec Grav, UA: >=1.03
Urobilinogen, UA: NEGATIVE

## 2016-12-20 MED ORDER — PHENAZOPYRIDINE HCL 100 MG PO TABS: 100.0000 mg | ORAL_TABLET | Freq: Three times a day (TID) | ORAL | 0 refills | Status: DC | PRN

## 2016-12-20 NOTE — Progress Notes (Signed)
Subjective:    Patient ID: Marvin Parker, male    DOB: 09/16/1946, 71 y.o.   MRN: [id number]  Chief Complaint  Patient presents with  . Establish Care    penis pain like a needle sticking it, did go to urology    HPI:  Marvin Parker is a 71 y.o. male who  has a past medical history of History of radiation therapy (07/07/14-08/29/14) and Prostate cancer ([place]) (02/17/14). and presents today for an office visit to establish care.    1.) Dysuria -  This is a new problem. Associated symptom of pain described as like a needle sticking in it has been going on for about 4 months. He is status post prostate cancer radiation treatment. He has been seen by Urology for similar symptoms and was prescribed Tramadol for the pain which he indicates has not helped very much. No injury or trauma that he is aware. Pain generally waxes and wanes. Aggravated when he urinates. There is no pain currently. Improves after urination. Denies have some difficulty starting a stream of urine. No hematuria. Not currently sexually active. Denies discharge or rashes. Does have some urgency on occasion.   2.) Erectile dysfunction - This is a new problem. Associated symptom of obtaining and maintained an erection at times has been going on for a long time. Previously prescribed Avanafil in the past which he indicates did help with his symptoms.   No Known Allergies    Outpatient Medications Prior to Visit  Medication Sig Dispense Refill  . acetaminophen (TYLENOL) 100 MG/ML solution Take 10 mg/kg by mouth every 4 (four) hours as needed for fever.    . Avanafil (STENDRA) 100 MG TABS Take by mouth.    . fesoterodine (TOVIAZ) 4 MG TB24 tablet Take 4 mg by mouth daily.    Marland Kitchen HYDROcodone-acetaminophen (NORCO/VICODIN) 5-325 MG tablet Take 1 tablet by mouth every 6 (six) hours as needed. 10 tablet 0  . meloxicam (MOBIC) 7.5 MG tablet Take 1 tablet (7.5 mg total) by mouth daily. 30 tablet 0  . meloxicam (MOBIC) 7.5 MG tablet Take  1 tablet (7.5 mg total) by mouth daily. 30 tablet 0  . tamsulosin (FLOMAX) 0.4 MG CAPS capsule Take 1 capsule (0.4 mg total) by mouth daily. 15 capsule 0  . traMADol (ULTRAM) 50 MG tablet Take 1 tablet (50 mg total) by mouth every 6 (six) hours as needed. 15 tablet 0  . traMADol (ULTRAM) 50 MG tablet Take 1 tablet (50 mg total) by mouth every 6 (six) hours as needed. 15 tablet 0   No facility-administered medications prior to visit.      Past Medical History:  Diagnosis Date  . History of radiation therapy 07/07/14-08/29/14   prostate 7605 cGy 39 sessions  . Prostate cancer ([place]) 02/17/14   Gleason 3+3, volume 91.34 cc      Past Surgical History:  Procedure Laterality Date  . CIRCUMCISION    . PROSTATE BIOPSY  02/17/14   gleason 3+3=6, 91.34 cc      Family History  Problem Relation Age of Onset  . Hypertension Father       Social History   Social History  . Marital status: Married    Spouse name: N/A  . Number of children: 5  . Years of education: 12   Occupational History  . Warehouse    Social History Main Topics  . Smoking status: Never Smoker  . Smokeless tobacco: Never Used  . Alcohol use Yes  Comment: Once a month  . Drug use: No  . Sexual activity: Not on file   Other Topics Concern  . Not on file   Social History Narrative  . No narrative on file      Review of Systems  Constitutional: Negative for chills and fever.  Respiratory: Negative for chest tightness and shortness of breath.   Genitourinary: Positive for dysuria. Negative for decreased urine volume, discharge, flank pain, frequency, hematuria, penile pain, penile swelling, scrotal swelling, testicular pain and urgency.       Positive for erectile dysfunction.       Objective:    BP 124/76 (BP Location: Left Arm, Patient Position: Sitting, Cuff Size: Large)   Pulse 61   Temp 97.7 F (36.5 C) (Oral)   Resp 16   Ht 5\' 8"  (1.727 m)   Wt 191 lb 12.8 oz (87 kg)   SpO2 94%    BMI 29.16 kg/m  Nursing note and vital signs reviewed.  Physical Exam  Constitutional: He is oriented to person, place, and time. He appears well-developed and well-nourished. No distress.  Cardiovascular: Normal rate, regular rhythm, normal heart sounds and intact distal pulses.   Pulmonary/Chest: Effort normal and breath sounds normal.  Genitourinary: Testes normal and penis normal. Cremasteric reflex is present. Circumcised. No penile erythema or penile tenderness. No discharge found.  Neurological: He is alert and oriented to person, place, and time.  Skin: Skin is warm and dry.  Psychiatric: He has a normal mood and affect. His behavior is normal. Judgment and thought content normal.        Assessment & Plan:   Problem List Items Addressed This Visit      Genitourinary   Erectile dysfunction    Erectile dysfunction in the setting of remission of prostate cancer. Recommend treatment for dysuria with medication consideration for phosphodiesterase 5 inhibitor. Continue to monitor.        Other   Dysuria - Primary    Dysuria in the setting of s/p prostate cancer in remission x 2 years. In office UA was negative for leukocytes, nitrites or hematuria. Urine will be sent for culture. External exam with no significant findings. Recommend follow up with urology pending urine culture results if symptoms do not improve. Start pyridium as needed for dysuria. Continue to monitor pending culture results.       Relevant Orders   Urine culture   POCT urinalysis dipstick (Completed)    Other Visit Diagnoses    Encounter for immunization       Relevant Medications   phenazopyridine (PYRIDIUM) 100 MG tablet   Other Relevant Orders   Flu vaccine HIGH DOSE PF (Completed)       I have discontinued Mr. Catalano's HYDROcodone-acetaminophen, tamsulosin, meloxicam, traMADol, meloxicam, and traMADol. I am also having him start on phenazopyridine. Additionally, I am having him maintain his  Avanafil, acetaminophen, and fesoterodine.   Meds ordered this encounter  Medications  . phenazopyridine (PYRIDIUM) 100 MG tablet    Sig: Take 1 tablet (100 mg total) by mouth 3 (three) times daily as needed for pain.    Dispense:  30 tablet    Refill:  0    Order Specific Question:   Supervising Provider    Answer:   Pricilla Holm A [id number]     Follow-up: Return in about 1 month (around 01/17/2017), or if symptoms worsen or fail to improve.  Mauricio Po, FNP

## 2016-12-20 NOTE — Assessment & Plan Note (Signed)
Erectile dysfunction in the setting of remission of prostate cancer. Recommend treatment for dysuria with medication consideration for phosphodiesterase 5 inhibitor. Continue to monitor.

## 2016-12-20 NOTE — Assessment & Plan Note (Signed)
Dysuria in the setting of s/p prostate cancer in remission x 2 years. In office UA was negative for leukocytes, nitrites or hematuria. Urine will be sent for culture. External exam with no significant findings. Recommend follow up with urology pending urine culture results if symptoms do not improve. Start pyridium as needed for dysuria. Continue to monitor pending culture results.

## 2016-12-20 NOTE — Patient Instructions (Addendum)
Thank you for choosing [facility].  SUMMARY AND INSTRUCTIONS:  Please start the pyridium as needed for painful urination.  We will send your urine for culture.  If it is all normal, may need follow up with Dr. Junious Silk for additional workup.   Medication:   Your prescription(s) have been submitted to your pharmacy or been printed and provided for you. Please take as directed and contact our office if you believe you are having problem(s) with the medication(s) or have any questions.  Follow up:  If your symptoms worsen or fail to improve, please contact our office for further instruction, or in case of emergency go directly to the emergency room at the closest medical facility.    Dysuria Introduction Dysuria is pain or discomfort while urinating. The pain or discomfort may be felt in the tube that carries urine out of the bladder (urethra) or in the surrounding tissue of the genitals. The pain may also be felt in the groin area, lower abdomen, and lower back. You may have to urinate frequently or have the sudden feeling that you have to urinate (urgency). Dysuria can affect both men and women, but is more common in women. Dysuria can be caused by many different things, including:  Urinary tract infection in women.  Infection of the kidney or bladder.  Kidney stones or bladder stones.  Certain sexually transmitted infections (STIs), such as chlamydia.  Dehydration.  Inflammation of the vagina.  Use of certain medicines.  Use of certain soaps or scented products that cause irritation. Follow these instructions at home: Watch your dysuria for any changes. The following actions may help to reduce any discomfort you are feeling:  Drink enough fluid to keep your urine clear or pale yellow.  Empty your bladder often. Avoid holding urine for long periods of time.  After a bowel movement or urination, women should cleanse from front to back, using each tissue only  once.  Empty your bladder after sexual intercourse.  Take medicines only as directed by your health care provider.  If you were prescribed an antibiotic medicine, finish it all even if you start to feel better.  Avoid caffeine, tea, and alcohol. They can irritate the bladder and make dysuria worse. In men, alcohol may irritate the prostate.  Keep all follow-up visits as directed by your health care provider. This is important.  If you had any tests done to find the cause of dysuria, it is your responsibility to obtain your test results. Ask the lab or department performing the test when and how you will get your results. Talk with your health care provider if you have any questions about your results. Contact a health care provider if:  You develop pain in your back or sides.  You have a fever.  You have nausea or vomiting.  You have blood in your urine.  You are not urinating as often as you usually do. Get help right away if:  You pain is severe and not relieved with medicines.  You are unable to hold down any fluids.  You or someone else notices a change in your mental function.  You have a rapid heartbeat at rest.  You have shaking or chills.  You feel extremely weak. This information is not intended to replace advice given to you by your health care provider. Make sure you discuss any questions you have with your health care provider. Document Released: 07/22/2004 Document Revised: 03/31/2016 Document Reviewed: 06/19/2014  2017 Elsevier

## 2016-12-21 LAB — URINE CULTURE: ORGANISM ID, BACTERIA: NO GROWTH

## 2017-01-09 DIAGNOSIS — H40033 Anatomical narrow angle, bilateral: Secondary | ICD-10-CM | POA: Diagnosis not present

## 2017-01-09 DIAGNOSIS — H2513 Age-related nuclear cataract, bilateral: Secondary | ICD-10-CM | POA: Diagnosis not present

## 2017-01-17 DIAGNOSIS — H578 Other specified disorders of eye and adnexa: Secondary | ICD-10-CM | POA: Diagnosis not present

## 2017-03-20 DIAGNOSIS — C61 Malignant neoplasm of prostate: Secondary | ICD-10-CM | POA: Diagnosis not present

## 2017-03-27 DIAGNOSIS — N400 Enlarged prostate without lower urinary tract symptoms: Secondary | ICD-10-CM | POA: Diagnosis not present

## 2017-03-27 DIAGNOSIS — C61 Malignant neoplasm of prostate: Secondary | ICD-10-CM | POA: Diagnosis not present

## 2017-03-27 DIAGNOSIS — N5201 Erectile dysfunction due to arterial insufficiency: Secondary | ICD-10-CM | POA: Diagnosis not present

## 2017-07-06 ENCOUNTER — Ambulatory Visit: Payer: Self-pay | Admitting: Family

## 2017-07-13 ENCOUNTER — Ambulatory Visit (INDEPENDENT_AMBULATORY_CARE_PROVIDER_SITE_OTHER): Payer: Medicare HMO | Admitting: Family

## 2017-07-13 ENCOUNTER — Other Ambulatory Visit (INDEPENDENT_AMBULATORY_CARE_PROVIDER_SITE_OTHER): Payer: Medicare HMO

## 2017-07-13 ENCOUNTER — Encounter: Payer: Self-pay | Admitting: Family

## 2017-07-13 VITALS — BP 124/68 | HR 64 | Temp 97.9°F | Resp 16 | Ht 68.0 in | Wt 195.4 lb

## 2017-07-13 DIAGNOSIS — R300 Dysuria: Secondary | ICD-10-CM

## 2017-07-13 DIAGNOSIS — R600 Localized edema: Secondary | ICD-10-CM | POA: Insufficient documentation

## 2017-07-13 LAB — URINALYSIS
Bilirubin Urine: NEGATIVE
HGB URINE DIPSTICK: NEGATIVE
Ketones, ur: NEGATIVE
Leukocytes, UA: NEGATIVE
Nitrite: NEGATIVE
Specific Gravity, Urine: 1.025 (ref 1.000–1.030)
TOTAL PROTEIN, URINE-UPE24: NEGATIVE
URINE GLUCOSE: NEGATIVE
UROBILINOGEN UA: 0.2 (ref 0.0–1.0)
pH: 6.5 (ref 5.0–8.0)

## 2017-07-13 LAB — CBC
HEMATOCRIT: 42.6 % (ref 39.0–52.0)
Hemoglobin: 13.9 g/dL (ref 13.0–17.0)
MCHC: 32.8 g/dL (ref 30.0–36.0)
MCV: 96.2 fl (ref 78.0–100.0)
Platelets: 151 10*3/uL (ref 150.0–400.0)
RBC: 4.43 Mil/uL (ref 4.22–5.81)
RDW: 14.1 % (ref 11.5–15.5)
WBC: 3.7 10*3/uL — ABNORMAL LOW (ref 4.0–10.5)

## 2017-07-13 LAB — COMPREHENSIVE METABOLIC PANEL
ALT: 15 U/L (ref 0–53)
AST: 18 U/L (ref 0–37)
Albumin: 4.1 g/dL (ref 3.5–5.2)
Alkaline Phosphatase: 58 U/L (ref 39–117)
BILIRUBIN TOTAL: 0.5 mg/dL (ref 0.2–1.2)
BUN: 19 mg/dL (ref 6–23)
CHLORIDE: 106 meq/L (ref 96–112)
CO2: 28 meq/L (ref 19–32)
CREATININE: 1.14 mg/dL (ref 0.40–1.50)
Calcium: 9.6 mg/dL (ref 8.4–10.5)
GFR: 81.27 mL/min (ref >60.00)
GLUCOSE: 88 mg/dL (ref 70–99)
Potassium: 4.7 mEq/L (ref 3.5–5.1)
SODIUM: 140 meq/L (ref 135–145)
Total Protein: 6.7 g/dL (ref 6.0–8.3)

## 2017-07-13 NOTE — Assessment & Plan Note (Signed)
Chronic dysuria in the setting of previous prostate cancer. No costovertebral tenderness or fevers. Obtain UA and urine culture. Continue to drink plenty of fluids. Pyridum as needed. Follow up pending lab results.

## 2017-07-13 NOTE — Patient Instructions (Signed)
Thank you for choosing [facility].  SUMMARY AND INSTRUCTIONS:  Elevate your legs when seated.   Decrease sodium/salt intake.  Compression socks as needed.   We will check your kidney function, liver function and urine today.  Additional treatment pending results as needed.  Labs:  Please stop by the lab on the lower level of the building for your blood work. Your results will be released to [place] (or called to you) after review, usually within 72 hours after test completion. If any changes need to be made, you will be notified at that same time.  1.) The lab is open from 7:30am to 5:30 pm Monday-Friday 2.) No appointment is necessary 3.) Fasting (if needed) is 6-8 hours after food and drink; black coffee and water are okay   Follow up:  If your symptoms worsen or fail to improve, please contact our office for further instruction, or in case of emergency go directly to the emergency room at the closest medical facility.    Edema Edema is when you have too much fluid in your body or under your skin. Edema may make your legs, feet, and ankles swell up. Swelling is also common in looser tissues, like around your eyes. This is a common condition. It gets more common as you get older. There are many possible causes of edema. Eating too much salt (sodium) and being on your feet or sitting for a long time can cause edema in your legs, feet, and ankles. Hot weather may make edema worse. Edema is usually painless. Your skin may look swollen or shiny. Follow these instructions at home:  Keep the swollen body part raised (elevated) above the level of your heart when you are sitting or lying down.  Do not sit still or stand for a long time.  Do not wear tight clothes. Do not wear garters on your upper legs.  Exercise your legs. This can help the swelling go down.  Wear elastic bandages or support stockings as told by your doctor.  Eat a low-salt (low-sodium) diet to reduce  fluid as told by your doctor.  Depending on the cause of your swelling, you may need to limit how much fluid you drink (fluid restriction).  Take over-the-counter and prescription medicines only as told by your doctor. Contact a doctor if:  Treatment is not working.  You have heart, liver, or kidney disease and have symptoms of edema.  You have sudden and unexplained weight gain. Get help right away if:  You have shortness of breath or chest pain.  You cannot breathe when you lie down.  You have pain, redness, or warmth in the swollen areas.  You have heart, liver, or kidney disease and get edema all of a sudden.  You have a fever and your symptoms get worse all of a sudden. Summary  Edema is when you have too much fluid in your body or under your skin.  Edema may make your legs, feet, and ankles swell up. Swelling is also common in looser tissues, like around your eyes.  Raise (elevate) the swollen body part above the level of your heart when you are sitting or lying down.  Follow your doctor's instructions about diet and how much fluid you can drink (fluid restriction). This information is not intended to replace advice given to you by your health care provider. Make sure you discuss any questions you have with your health care provider. Document Released: 04/11/2008 Document Revised: 11/11/2016 Document Reviewed: 11/11/2016 Elsevier Interactive  Patient Education  2017 [employer].

## 2017-07-13 NOTE — Progress Notes (Signed)
Subjective:    Patient ID: Marvin Parker, male    DOB: 09-25-1946, 71 y.o.   MRN: [id number]  Chief Complaint  Patient presents with  . Foot Swelling    feet swelling and dysuria    HPI:  Marvin Parker is a 71 y.o. male who  has a past medical history of History of radiation therapy (07/07/14-08/29/14) and Prostate cancer ([place]) (02/17/14). and presents today for an office visit.  1.) Lower extremity edema - This is a new problem. Associated symptom of edema located in his bilateral lower extremities has been going on for about 3 weeks. Modifying factors include massage. Endorses increased sodium intake. Course of symptoms are improved since initial onset.   2.) Dysuria - This is a new problem. Associated symptom of dysuria has been going on for several months. Does have some urgency and hematuria. No flank pain, fevers of abdominal pain. Previous history of prostate cancer.    No Known Allergies    Outpatient Medications Prior to Visit  Medication Sig Dispense Refill  . acetaminophen (TYLENOL) 100 MG/ML solution Take 10 mg/kg by mouth every 4 (four) hours as needed for fever.    . Avanafil (STENDRA) 100 MG TABS Take by mouth.    . fesoterodine (TOVIAZ) 4 MG TB24 tablet Take 4 mg by mouth daily.    . phenazopyridine (PYRIDIUM) 100 MG tablet Take 1 tablet (100 mg total) by mouth 3 (three) times daily as needed for pain. 30 tablet 0   No facility-administered medications prior to visit.       Past Surgical History:  Procedure Laterality Date  . CIRCUMCISION    . PROSTATE BIOPSY  02/17/14   gleason 3+3=6, 91.34 cc      Past Medical History:  Diagnosis Date  . History of radiation therapy 07/07/14-08/29/14   prostate 7605 cGy 39 sessions  . Prostate cancer ([place]) 02/17/14   Gleason 3+3, volume 91.34 cc      Review of Systems  Constitutional: Negative for chills and fever.  Respiratory: Negative for chest tightness, shortness of breath and wheezing.   Cardiovascular:  Positive for leg swelling. Negative for chest pain and palpitations.  Gastrointestinal: Negative for abdominal pain.  Genitourinary: Positive for dysuria, hematuria and urgency. Negative for flank pain, frequency and testicular pain.      Objective:    BP 124/68 (BP Location: Left Arm, Patient Position: Sitting, Cuff Size: Large)   Pulse 64   Temp 97.9 F (36.6 C) (Oral)   Resp 16   Ht 5' 8"  (1.727 m)   Wt 195 lb 6.4 oz (88.6 kg)   SpO2 96%   BMI 29.71 kg/m  Nursing note and vital signs reviewed.  Physical Exam  Constitutional: He is oriented to person, place, and time. He appears well-developed and well-nourished. No distress.  Cardiovascular: Normal rate, regular rhythm, normal heart sounds and intact distal pulses.   Pulmonary/Chest: Effort normal and breath sounds normal.  Abdominal: There is no hepatosplenomegaly. There is no tenderness. There is no rigidity, no rebound, no guarding, no CVA tenderness, no tenderness at McBurney's point and negative Murphy's sign.  Neurological: He is alert and oriented to person, place, and time.  Skin: Skin is warm and dry.  Psychiatric: He has a normal mood and affect. His behavior is normal. Judgment and thought content normal.       Assessment & Plan:   Problem List Items Addressed This Visit      Other   Dysuria  Chronic dysuria in the setting of previous prostate cancer. No costovertebral tenderness or fevers. Obtain UA and urine culture. Continue to drink plenty of fluids. Pyridum as needed. Follow up pending lab results.       Relevant Orders   Urinalysis   Urine Culture   Lower extremity edema - Primary    New onset lower extremity edema that appears to be stable with no cardiac symptoms, shortness of breath or calf pain. Obtain CMET. Likely related to sodium intake. Treat conservatively with elevation, decreased sodium and compression socks as needed. If symptoms worsen consider diuretic, imaging and sleep apnea testing.        Relevant Orders   Comp Met (CMET)   CBC       I am having Mr. Baumgarten maintain his Avanafil, acetaminophen, fesoterodine, and phenazopyridine.   Follow-up: Return if symptoms worsen or fail to improve.  Mauricio Po, FNP

## 2017-07-13 NOTE — Assessment & Plan Note (Signed)
New onset lower extremity edema that appears to be stable with no cardiac symptoms, shortness of breath or calf pain. Obtain CMET. Likely related to sodium intake. Treat conservatively with elevation, decreased sodium and compression socks as needed. If symptoms worsen consider diuretic, imaging and sleep apnea testing.

## 2017-07-14 LAB — URINE CULTURE
MICRO NUMBER: 80979421
RESULT: NO GROWTH
SPECIMEN QUALITY: ADEQUATE

## 2017-12-25 DIAGNOSIS — R3915 Urgency of urination: Secondary | ICD-10-CM | POA: Diagnosis not present

## 2017-12-25 DIAGNOSIS — N5201 Erectile dysfunction due to arterial insufficiency: Secondary | ICD-10-CM | POA: Diagnosis not present

## 2017-12-25 DIAGNOSIS — C61 Malignant neoplasm of prostate: Secondary | ICD-10-CM | POA: Diagnosis not present

## 2017-12-25 DIAGNOSIS — N400 Enlarged prostate without lower urinary tract symptoms: Secondary | ICD-10-CM | POA: Diagnosis not present

## 2018-02-19 DIAGNOSIS — H40033 Anatomical narrow angle, bilateral: Secondary | ICD-10-CM | POA: Diagnosis not present

## 2018-02-19 DIAGNOSIS — H1013 Acute atopic conjunctivitis, bilateral: Secondary | ICD-10-CM | POA: Diagnosis not present

## 2018-03-26 DIAGNOSIS — R300 Dysuria: Secondary | ICD-10-CM | POA: Diagnosis not present

## 2018-03-26 DIAGNOSIS — H2513 Age-related nuclear cataract, bilateral: Secondary | ICD-10-CM | POA: Diagnosis not present

## 2018-03-26 DIAGNOSIS — Z8546 Personal history of malignant neoplasm of prostate: Secondary | ICD-10-CM | POA: Diagnosis not present

## 2018-03-30 DIAGNOSIS — H11153 Pinguecula, bilateral: Secondary | ICD-10-CM | POA: Diagnosis not present

## 2018-03-30 DIAGNOSIS — H25813 Combined forms of age-related cataract, bilateral: Secondary | ICD-10-CM | POA: Diagnosis not present

## 2018-04-14 DIAGNOSIS — R030 Elevated blood-pressure reading, without diagnosis of hypertension: Secondary | ICD-10-CM | POA: Diagnosis not present

## 2018-04-14 DIAGNOSIS — I10 Essential (primary) hypertension: Secondary | ICD-10-CM | POA: Diagnosis not present

## 2018-09-24 DIAGNOSIS — N400 Enlarged prostate without lower urinary tract symptoms: Secondary | ICD-10-CM | POA: Diagnosis not present

## 2018-09-24 DIAGNOSIS — Z8546 Personal history of malignant neoplasm of prostate: Secondary | ICD-10-CM | POA: Diagnosis not present

## 2019-01-07 DIAGNOSIS — M25511 Pain in right shoulder: Secondary | ICD-10-CM | POA: Diagnosis not present

## 2019-01-07 DIAGNOSIS — M7541 Impingement syndrome of right shoulder: Secondary | ICD-10-CM | POA: Diagnosis not present

## 2019-09-30 ENCOUNTER — Telehealth: Payer: Self-pay

## 2019-09-30 NOTE — Telephone Encounter (Signed)
Copied from [place] [id number]. Topic: General - Other >> Sep 27, 2019  3:16 PM Celene Kras wrote: Reason for CRM:  Pt called and is wanting to establish care with a doctor at this practice. Please advise. >> Sep 30, 2019  8:21 AM Para Skeans A wrote: Would [facility] like to accept this patient?

## 2019-10-07 NOTE — Telephone Encounter (Signed)
I am wondering if he would prefer a male MD; in reviewing his chart, that is who he has worked with.  1) Might be worth asking Dr. Jenny Reichmann 1st; if he says no, I can see the patient if the patient is comfortable or can refer to male provider at another [facility] clinic.

## 2019-10-07 NOTE — Telephone Encounter (Signed)
Ok with me, thanks.

## 2019-10-07 NOTE — Telephone Encounter (Signed)
Dr.John would you be willing to accept this patient?

## 2019-10-08 NOTE — Telephone Encounter (Signed)
Appointment has been made for 12/7.

## 2019-10-10 DIAGNOSIS — R3913 Splitting of urinary stream: Secondary | ICD-10-CM | POA: Diagnosis not present

## 2019-10-10 DIAGNOSIS — R310 Gross hematuria: Secondary | ICD-10-CM | POA: Diagnosis not present

## 2019-10-10 DIAGNOSIS — Z8546 Personal history of malignant neoplasm of prostate: Secondary | ICD-10-CM | POA: Diagnosis not present

## 2019-10-10 DIAGNOSIS — N400 Enlarged prostate without lower urinary tract symptoms: Secondary | ICD-10-CM | POA: Diagnosis not present

## 2019-10-14 ENCOUNTER — Encounter: Payer: Medicare HMO | Admitting: Internal Medicine

## 2019-10-31 DIAGNOSIS — R310 Gross hematuria: Secondary | ICD-10-CM | POA: Diagnosis not present

## 2019-10-31 DIAGNOSIS — N281 Cyst of kidney, acquired: Secondary | ICD-10-CM | POA: Diagnosis not present

## 2019-11-19 DIAGNOSIS — R310 Gross hematuria: Secondary | ICD-10-CM | POA: Diagnosis not present

## 2019-11-19 DIAGNOSIS — N401 Enlarged prostate with lower urinary tract symptoms: Secondary | ICD-10-CM | POA: Diagnosis not present

## 2019-11-19 DIAGNOSIS — R3915 Urgency of urination: Secondary | ICD-10-CM | POA: Diagnosis not present

## 2019-11-19 DIAGNOSIS — N5201 Erectile dysfunction due to arterial insufficiency: Secondary | ICD-10-CM | POA: Diagnosis not present

## 2020-06-15 ENCOUNTER — Telehealth: Payer: Self-pay | Admitting: *Deleted

## 2020-06-15 NOTE — Telephone Encounter (Signed)
Patient no showed PV today- Called patient 1344 pm  and left message to return call by 5 pm today- If no call by 5 pm, PV and procedure will be canceled - no call at 5 pm -  PV and Procedure both canceled- No Show letter mailed to patient  Marijean Niemann

## 2020-06-30 ENCOUNTER — Encounter: Payer: Medicare HMO | Admitting: Gastroenterology

## 2020-08-13 ENCOUNTER — Ambulatory Visit: Payer: Self-pay | Admitting: Nurse Practitioner

## 2020-08-17 ENCOUNTER — Encounter (HOSPITAL_COMMUNITY): Payer: Self-pay | Admitting: *Deleted

## 2020-08-17 ENCOUNTER — Other Ambulatory Visit: Payer: Self-pay

## 2020-08-17 ENCOUNTER — Ambulatory Visit (HOSPITAL_COMMUNITY)
Admission: EM | Admit: 2020-08-17 | Discharge: 2020-08-17 | Disposition: A | Discharge status: Home / Self Care | Payer: Medicare (Managed Care)

## 2020-08-17 DIAGNOSIS — R109 Unspecified abdominal pain: Secondary | ICD-10-CM | POA: Diagnosis not present

## 2020-08-17 DIAGNOSIS — R300 Dysuria: Secondary | ICD-10-CM

## 2020-08-17 LAB — POCT URINALYSIS DIPSTICK, ED / UC
Bilirubin Urine: NEGATIVE
Bilirubin Urine: NEGATIVE
Glucose, UA: NEGATIVE mg/dL
Glucose, UA: NEGATIVE mg/dL
Hgb urine dipstick: NEGATIVE
Hgb urine dipstick: NEGATIVE
Ketones, ur: NEGATIVE mg/dL
Ketones, ur: NEGATIVE mg/dL
Leukocytes,Ua: NEGATIVE
Leukocytes,Ua: NEGATIVE
Nitrite: NEGATIVE
Nitrite: NEGATIVE
Protein, ur: NEGATIVE mg/dL
Protein, ur: NEGATIVE mg/dL
Specific Gravity, Urine: 1.025 (ref 1.005–1.030)
Specific Gravity, Urine: >=1.03 (ref 1.005–1.030)
Urobilinogen, UA: 0.2 mg/dL (ref 0.0–1.0)
Urobilinogen, UA: 0.2 mg/dL (ref 0.0–1.0)
pH: 5.5 (ref 5.0–8.0)
pH: 5.5 (ref 5.0–8.0)

## 2020-08-17 NOTE — ED Triage Notes (Addendum)
Patient in with complaints of lower abdominal pain x 2 months that has been increasing. Patient states that he has burning with urination at the tip of penis x 2 months. Patient denies fever, color change or odor to urine. Patient states that sometimes he experiences frequency but not every day. Hx of prostate CA. Patient also requesting to have an abdominal xray.

## 2020-08-17 NOTE — Discharge Instructions (Addendum)
I have referred you to urology.  I would recommend follow-up with your primary care doctor.  I have put to contact for primary care on your discharge instructions. Follow up as needed for continued or worsening symptoms

## 2020-08-18 NOTE — ED Provider Notes (Signed)
[place]    CSN: [id number] Arrival date & time: 08/17/20  1239      History   Chief Complaint Chief Complaint  Patient presents with  . Abdominal Pain    HPI Marvin Parker is a 74 y.o. male.   Patient is a 60-year male with past medical history of prostate cancer.  He presents today with complaints of intermittent lower abdominal pain for the past 2 months that has been increasing.  Has had some mild dysuria and dribbling.  Denies any associated fever, nausea, vomiting.  Experiences urine frequency every day.     Past Medical History:  Diagnosis Date  . History of radiation therapy 07/07/14-08/29/14   prostate 7605 cGy 39 sessions  . Prostate cancer ([name]) 02/17/14   Gleason 3+3, volume 91.34 cc    Patient Active Problem List   Diagnosis Date Noted  . Lower extremity edema 07/13/2017  . Erectile dysfunction 12/20/2016  . Dysuria 07/15/2014  . Malignant neoplasm of prostate ([place]) 04/23/2014  . ABDOMINAL PAIN, PERIUMBILIC 47/82/9562    Past Surgical History:  Procedure Laterality Date  . CIRCUMCISION    . PROSTATE BIOPSY  02/17/14   gleason 3+3=6, 91.34 cc       Home Medications    Prior to Admission medications   Medication Sig Start Date End Date Taking? Authorizing Provider  tamsulosin (FLOMAX) 0.4 MG CAPS capsule Take 0.4 mg by mouth daily. 08/03/20  Yes [provider]  acetaminophen (TYLENOL) 100 MG/ML solution Take 10 mg/kg by mouth every 4 (four) hours as needed for fever.    [provider]    Family History Family History  Problem Relation Age of Onset  . Hypertension Father     Social History Social History   Tobacco Use  . Smoking status: Never Smoker  . Smokeless tobacco: Never Used  Vaping Use  . Vaping Use: Never used  Substance Use Topics  . Alcohol use: Yes    Comment: Once a month  . Drug use: No     Allergies   Patient has no known allergies.   Review of Systems Review of  Systems   Physical Exam Triage Vital Signs ED Triage Vitals  Enc Vitals Group     BP 08/17/20 1449 (!) 151/84     Pulse Rate 08/17/20 1449 (!) 58     Resp 08/17/20 1449 (!) 24     Temp 08/17/20 1449 97.7 F (36.5 C)     Temp Source 08/17/20 1449 Oral     SpO2 08/17/20 1449 99 %     Weight --      Height --      Head Circumference --      Peak Flow --      Pain Score 08/17/20 1447 5     Pain Loc --      Pain Edu? --      Excl. in [place]? --    No data found.  Updated Vital Signs BP (!) 151/84 (BP Location: Right Arm)   Pulse (!) 58   Temp 97.7 F (36.5 C) (Oral)   Resp (!) 24   SpO2 99%   Visual Acuity Right Eye Distance:   Left Eye Distance:   Bilateral Distance:    Right Eye Near:   Left Eye Near:    Bilateral Near:     Physical Exam Vitals and nursing note reviewed.  Constitutional:      Appearance: Normal appearance.  HENT:  Head: Normocephalic and atraumatic.     Nose: Nose normal.  Eyes:     Conjunctiva/sclera: Conjunctivae normal.  Pulmonary:     Effort: Pulmonary effort is normal.  Musculoskeletal:        General: Normal range of motion.     Cervical back: Normal range of motion.  Skin:    General: Skin is warm and dry.  Neurological:     Mental Status: He is alert.  Psychiatric:        Mood and Affect: Mood normal.      UC Treatments / Results  Labs (all labs ordered are listed, but only abnormal results are displayed) Labs Reviewed  POCT URINALYSIS DIPSTICK, ED / UC  POCT URINALYSIS DIPSTICK, ED / UC    EKG   Radiology No results found.  Procedures Procedures (including critical care time)  Medications Ordered in UC Medications - No data to display  Initial Impression / Assessment and Plan / UC Course  I have reviewed the triage vital signs and the nursing notes.  Pertinent labs & imaging results that were available during my care of the patient were reviewed by me and considered in my medical decision making (see chart  for details).     Dysuria Urinalysis without any infection here today.  Most likely symptoms are related to some other underlying issue in the urinary tract versus prostate issue. We will have him follow with urology Referral put in for this Final Clinical Impressions(s) / UC Diagnoses   Final diagnoses:  Dysuria     Discharge Instructions     I have referred you to urology.  I would recommend follow-up with your primary care doctor.  I have put to contact for primary care on your discharge instructions. Follow up as needed for continued or worsening symptoms     ED Prescriptions    None     PDMP not reviewed this encounter.   Loura Halt A, NP 08/18/20 1100

## 2020-09-28 ENCOUNTER — Ambulatory Visit: Payer: Medicare (Managed Care) | Admitting: Urology

## 2020-09-28 DIAGNOSIS — C61 Malignant neoplasm of prostate: Secondary | ICD-10-CM

## 2020-09-30 ENCOUNTER — Ambulatory Visit: Payer: Medicare (Managed Care) | Admitting: Urology

## 2020-09-30 ENCOUNTER — Other Ambulatory Visit: Payer: Self-pay

## 2020-09-30 ENCOUNTER — Encounter: Payer: Self-pay | Admitting: Urology

## 2020-09-30 ENCOUNTER — Ambulatory Visit (INDEPENDENT_AMBULATORY_CARE_PROVIDER_SITE_OTHER): Payer: Medicare (Managed Care) | Admitting: Urology

## 2020-09-30 VITALS — BP 108/64 | HR 69 | Temp 98.4°F | Ht 67.0 in | Wt 175.0 lb

## 2020-09-30 DIAGNOSIS — C61 Malignant neoplasm of prostate: Secondary | ICD-10-CM

## 2020-09-30 DIAGNOSIS — N5201 Erectile dysfunction due to arterial insufficiency: Secondary | ICD-10-CM | POA: Diagnosis not present

## 2020-09-30 DIAGNOSIS — N401 Enlarged prostate with lower urinary tract symptoms: Secondary | ICD-10-CM

## 2020-09-30 DIAGNOSIS — R3912 Poor urinary stream: Secondary | ICD-10-CM | POA: Diagnosis not present

## 2020-09-30 DIAGNOSIS — N138 Other obstructive and reflux uropathy: Secondary | ICD-10-CM

## 2020-09-30 MED ORDER — SILDENAFIL CITRATE 100 MG PO TABS: 100.0000 mg | ORAL_TABLET | Freq: Every day | ORAL | 5 refills | Status: AC | PRN

## 2020-09-30 MED ORDER — FINASTERIDE 5 MG PO TABS: 5.0000 mg | ORAL_TABLET | Freq: Every day | ORAL | 3 refills | Status: DC

## 2020-09-30 MED ORDER — ALFUZOSIN HCL ER 10 MG PO TB24: 10.0000 mg | ORAL_TABLET | Freq: Every evening | ORAL | 11 refills | Status: AC

## 2020-09-30 NOTE — Patient Instructions (Signed)

## 2020-09-30 NOTE — Progress Notes (Signed)
09/30/2020 11:31 AM   Rashi Harshman 05/28/1946 [id number]  Referring provider: Golden Circle, FNP [place],  [place] [address]  Followup BPH  HPI: Mr Fieldhouse is a 74yo here for followup for BPH, ED, and gross hematuria. He previously seen by Dr. Junious Silk and Janice Norrie. He is on flomax for weak urinary stream and dysuria which failed to improve his stream. He has friable prostate tissue found on office cystoscopy by Dr. Junious Silk.  He has issues getting and maintaining an erection. He has previously tried sildenafil, tadalafil and edex. Edex worked but was painful.   His records from [facility] are as follows: I have prostate cancer (treatment).  HPI: Clarion Mooneyhan is a 74 year-old male established patient who is here for prostate cancer which has been treated.  He did receive radiation therapy for his cancer. He was treated with xrt for his cancer. Patient denies brachytherapy and high dose radiation. His radiation treatment was complete approximately 08/07/2014. He has not undergone Hormonal Therapy for treatment.   His PSA blood tests have been low since his prostate cancer treatment was started.   He does have problems with erections.   Adenocarcinoma of prostate Gleason 6 and PSA of 6.55, prostate 91 grams.  PSA dropped to 0.10. Treated by Dr. Janice Norrie.     CC: I have symptoms of an enlarged prostate.  HPI: He first noticed the symptoms approximately 03/07/2008. His symptoms have not gotten worse over the last year. He has been treated with Flomax and Uroxatral. The patient has never had a surgical procedure for bladder outlet obstruction to his prostate.   Prostate 91 g on imaging. S/p IMRT in 2015 for PCa. He was on tamsulosin but stopped it. He developed penile pain Nov 2017. He has a h/o intermittent penile pain he associates with a 2006 circumcision.   He had frequency and urgency. He voids with a good flow. PVR 103 ml. UA clear. He tried alfuzosin but didn't recall  if it helped. He also has bowel urgency. He tried tolterodine with no improvement in symptoms. He states only taking it for 1 month before discontinuing the medication. Biggest complaint continues to be painful voiding described as burning. No gross hematuria. PVR was 49 ml. Urine cx;s negative.   F/u AUASS = 5. Symptoms improved. PVR now 12 ml, but straining to void and a weak stream is bothersome. He has dysuria. He also noted some red urine in the toilet.   I have symptoms of an enlarged prostate.  HPI: Jacion Dismore is a 74 year-old male established patient who is here for symptoms of enlarged prostate.  He first noticed the symptoms approximately 03/07/2008. His symptoms have not gotten worse over the last year. He has been treated with Flomax and Uroxatral. The patient has never had a surgical procedure for bladder outlet obstruction to his prostate.   Prostate 91 g on imaging. S/p IMRT in 2015 for PCa. He was on tamsulosin but stopped it. He developed penile pain Nov 2017. He has a h/o intermittent penile pain he associates with a 2006 circumcision.   He had frequency and urgency. He voids with a good flow. PVR 103 ml. UA clear. He tried alfuzosin but didn't recall if it helped. He also has bowel urgency. He tried tolterodine with no improvement in symptoms. He states only taking it for 1 month before discontinuing the medication. Biggest complaint continues to be painful voiding described as burning. No gross hematuria. PVR was  49 ml. Urine cx;s negative.   F/u AUASS = 5. Symptoms improved. PVR now 12 ml, but straining to void and a weak stream is bothersome. He had dysuria. He has a pain in the perineum prior to voiding. He also noted some red urine in the toilet. CT benign 10/31/2019. Prostate 6 cm width with a small median lobe. Cysto today with friable prostate vessels, lateral lobe obstruction protruding into bladder R>L. Could Urolift but would need to be well back from [facility].        PMH: Past Medical History:  Diagnosis Date  . History of radiation therapy 07/07/14-08/29/14   prostate 7605 cGy 39 sessions  . Prostate cancer ([place]) 02/17/14   Gleason 3+3, volume 91.34 cc    Surgical History: Past Surgical History:  Procedure Laterality Date  . CIRCUMCISION    . PROSTATE BIOPSY  02/17/14   gleason 3+3=6, 91.34 cc    Home Medications:  Allergies as of 09/30/2020   No Known Allergies     Medication List       Accurate as of September 30, 2020 11:31 AM. If you have any questions, ask your nurse or doctor.        acetaminophen 100 MG/ML solution Commonly known as: TYLENOL Take 10 mg/kg by mouth every 4 (four) hours as needed for fever.   tamsulosin 0.4 MG Caps capsule Commonly known as: FLOMAX Take 0.4 mg by mouth daily.       Allergies: No Known Allergies  Family History: Family History  Problem Relation Age of Onset  . Hypertension Father     Social History:  reports that he has never smoked. He has never used smokeless tobacco. He reports current alcohol use. He reports that he does not use drugs.  ROS: All other review of systems were reviewed and are negative except what is noted above in HPI  Physical Exam: BP 108/64   Pulse 69   Temp 98.4 F (36.9 C)   Ht 5\' 7"  (1.702 m)   Wt 175 lb (79.4 kg)   BMI 27.41 kg/m   Constitutional:  Alert and oriented, No acute distress. HEENT: [place] AT, moist mucus membranes.  Trachea midline, no masses. Cardiovascular: No clubbing, cyanosis, or edema. Respiratory: Normal respiratory effort, no increased work of breathing. GI: Abdomen is soft, nontender, nondistended, no abdominal masses GU: No CVA tenderness. Circumcised phallus. No masses/lesions on penis, testis, scrotum. Prostate 40g smooth no nodules no induration.  Lymph: No cervical or inguinal lymphadenopathy. Skin: No rashes, bruises or suspicious lesions. Neurologic: Grossly intact, no focal deficits, moving all 4  extremities. Psychiatric: Normal mood and affect.  Laboratory Data: Lab Results  Component Value Date   WBC 3.7 (L) 07/13/2017   HGB 13.9 07/13/2017   HCT 42.6 07/13/2017   MCV 96.2 07/13/2017   PLT 151.0 07/13/2017    Lab Results  Component Value Date   CREATININE 1.14 07/13/2017    No results found for: PSA  No results found for: TESTOSTERONE  No results found for: HGBA1C  Urinalysis    Component Value Date/Time   COLORURINE YELLOW 07/13/2017 [place] 07/13/2017 0948   LABSPEC 1.025 08/17/2020 1556   LABSPEC 1.010 07/16/2014 0916   PHURINE 5.5 08/17/2020 [place] 08/17/2020 [place] 07/13/2017 0948   GLUCOSEU Negative 07/16/2014 [place] 08/17/2020 [place] 08/17/2020 1556   BILIRUBINUR negative 12/20/2016 1047   BILIRUBINUR Negative 07/16/2014 0916  KETONESUR NEGATIVE 08/17/2020 1556   PROTEINUR NEGATIVE 08/17/2020 1556   UROBILINOGEN 0.2 08/17/2020 1556   UROBILINOGEN 0.2 07/16/2014 0916   NITRITE NEGATIVE 08/17/2020 1556   LEUKOCYTESUR NEGATIVE 08/17/2020 1556   LEUKOCYTESUR Negative 07/16/2014 0916    Lab Results  Component Value Date   BACTERIA Negative 07/16/2014    Pertinent Imaging:  No results found for this or any previous visit.  No results found for this or any previous visit.  No results found for this or any previous visit.  No results found for this or any previous visit.  No results found for this or any previous visit.  No results found for this or any previous visit.  No results found for this or any previous visit.  No results found for this or any previous visit.   Assessment & Plan:    1. Prostate cancer ([place]) -PSA today, if stable RTC 1 year with PSA  2. Benign prostatic hyperplasia with urinary obstruction -We will trial uroxatral and finasteride  3. Weak urinary stream -Uroxatral 10mg   4. Erectile dysfunction -Sildenafil 100mg   prn   No follow-ups on file.  Nicolette Bang, MD  [facility] [place]

## 2020-09-30 NOTE — Progress Notes (Signed)
Urological Symptom Review  Patient is experiencing the following symptoms: Blood in urine Urinary tract infection   Review of Systems  Gastrointestinal (upper)  : Negative for upper GI symptoms  Gastrointestinal (lower) : Negative for lower GI symptoms  Constitutional : Negative for symptoms  Skin: Negative for skin symptoms  Eyes: Negative for eye symptoms  Ear/Nose/Throat : Negative for Ear/Nose/Throat symptoms  Hematologic/Lymphatic: Negative for Hematologic/Lymphatic symptoms  Cardiovascular : Negative for cardiovascular symptoms  Respiratory : Negative for respiratory symptoms  Endocrine: Negative for endocrine symptoms  Musculoskeletal: Negative for musculoskeletal symptoms  Neurological: Negative for neurological symptoms  Psychologic: Negative for psychiatric symptoms  

## 2020-11-10 ENCOUNTER — Other Ambulatory Visit: Payer: Self-pay

## 2020-11-10 ENCOUNTER — Emergency Department (HOSPITAL_COMMUNITY): Payer: Worker's Compensation

## 2020-11-10 ENCOUNTER — Encounter (HOSPITAL_COMMUNITY): Payer: Self-pay

## 2020-11-10 ENCOUNTER — Emergency Department (HOSPITAL_COMMUNITY)
Admission: EM | Admit: 2020-11-10 | Discharge: 2020-11-10 | Disposition: A | Discharge status: Home / Self Care | Payer: Worker's Compensation | Attending: Emergency Medicine | Admitting: Emergency Medicine

## 2020-11-10 DIAGNOSIS — W11XXXA Fall on and from ladder, initial encounter: Secondary | ICD-10-CM | POA: Insufficient documentation

## 2020-11-10 DIAGNOSIS — Z5321 Procedure and treatment not carried out due to patient leaving prior to being seen by health care provider: Secondary | ICD-10-CM | POA: Insufficient documentation

## 2020-11-10 DIAGNOSIS — M25511 Pain in right shoulder: Secondary | ICD-10-CM | POA: Insufficient documentation

## 2020-11-10 DIAGNOSIS — R52 Pain, unspecified: Secondary | ICD-10-CM

## 2020-11-10 DIAGNOSIS — Y990 Civilian activity done for income or pay: Secondary | ICD-10-CM | POA: Insufficient documentation

## 2020-11-10 NOTE — ED Triage Notes (Signed)
Pt reports he fell off a ladder in October while at work. Pt reports ongoing Right shoulder pain and edema to his Right elbow. Pt requesting further evaluation d/t not getting any better.

## 2020-11-10 NOTE — ED Notes (Signed)
Pt name called 3 times no answer 

## 2020-11-10 NOTE — ED Notes (Signed)
Called patient 3x for vitals, no response.  

## 2020-11-16 ENCOUNTER — Ambulatory Visit (INDEPENDENT_AMBULATORY_CARE_PROVIDER_SITE_OTHER): Payer: Medicare (Managed Care) | Admitting: Urology

## 2020-11-16 ENCOUNTER — Other Ambulatory Visit: Payer: Self-pay

## 2020-11-16 ENCOUNTER — Encounter: Payer: Self-pay | Admitting: Urology

## 2020-11-16 VITALS — BP 134/63 | HR 60 | Temp 97.9°F | Ht 67.0 in | Wt 175.0 lb

## 2020-11-16 DIAGNOSIS — R3912 Poor urinary stream: Secondary | ICD-10-CM | POA: Diagnosis not present

## 2020-11-16 DIAGNOSIS — N138 Other obstructive and reflux uropathy: Secondary | ICD-10-CM

## 2020-11-16 DIAGNOSIS — N5201 Erectile dysfunction due to arterial insufficiency: Secondary | ICD-10-CM | POA: Diagnosis not present

## 2020-11-16 DIAGNOSIS — N401 Enlarged prostate with lower urinary tract symptoms: Secondary | ICD-10-CM

## 2020-11-16 DIAGNOSIS — C61 Malignant neoplasm of prostate: Secondary | ICD-10-CM | POA: Diagnosis not present

## 2020-11-16 LAB — URINALYSIS, ROUTINE W REFLEX MICROSCOPIC
Bilirubin, UA: NEGATIVE
Glucose, UA: NEGATIVE
Ketones, UA: NEGATIVE
Leukocytes,UA: NEGATIVE
Nitrite, UA: NEGATIVE
Protein,UA: NEGATIVE
RBC, UA: NEGATIVE
Specific Gravity, UA: 1.025 (ref 1.005–1.030)
Urobilinogen, Ur: 0.2 mg/dL (ref 0.2–1.0)
pH, UA: 7 (ref 5.0–7.5)

## 2020-11-16 MED ORDER — VARDENAFIL HCL 20 MG PO TABS: 20.0000 mg | ORAL_TABLET | Freq: Every day | ORAL | 5 refills | Status: AC | PRN

## 2020-11-16 MED ORDER — TAMSULOSIN HCL 0.4 MG PO CAPS: 0.4000 mg | ORAL_CAPSULE | Freq: Every day | ORAL | 3 refills | Status: DC

## 2020-11-16 MED ORDER — FINASTERIDE 5 MG PO TABS
5.0000 mg | ORAL_TABLET | Freq: Every day | ORAL | 3 refills | Status: DC
Start: 2020-11-16 — End: 2022-11-09

## 2020-11-16 NOTE — Progress Notes (Signed)
11/16/2020 10:29 AM   Marvin Parker [birth year] [id number]  Referring provider: Golden Circle, FNP [place],  [place] [address]  followup BPH and Erectile dysfunction  HPI: Marvin Parker is a 75yo here for followup for BPH and erectile dysfunction. Last visit he tried sildenafil 100mg  which intermittent gives him an erection and then he cannot maintain the erection. Since staring the uroxatral last visit he did not notice an improvement in his LUTS.    PMH: Past Medical History:  Diagnosis Date  . History of radiation therapy 07/07/14-08/29/14   prostate 7605 cGy 39 sessions  . Prostate cancer ([place]) 02/17/14   Gleason 3+3, volume 91.34 cc    Surgical History: Past Surgical History:  Procedure Laterality Date  . CIRCUMCISION    . PROSTATE BIOPSY  02/17/14   gleason 3+3=6, 91.34 cc    Home Medications:  Allergies as of 11/16/2020   No Known Allergies     Medication List       Accurate as of November 16, 2020 10:29 AM. If you have any questions, ask your nurse or doctor.        acetaminophen 100 MG/ML solution Commonly known as: TYLENOL Take 10 mg/kg by mouth every 4 (four) hours as needed for fever.   alfuzosin 10 MG 24 hr tablet Commonly known as: UROXATRAL Take 1 tablet (10 mg total) by mouth at bedtime.   finasteride 5 MG tablet Commonly known as: PROSCAR Take 1 tablet (5 mg total) by mouth daily.   sildenafil 100 MG tablet Commonly known as: VIAGRA Take 1 tablet (100 mg total) by mouth daily as needed for erectile dysfunction.   tamsulosin 0.4 MG Caps capsule Commonly known as: FLOMAX Take 0.4 mg by mouth daily.       Allergies: No Known Allergies  Family History: Family History  Problem Relation Age of Onset  . Hypertension Father     Social History:  reports that he has never smoked. He has never used smokeless tobacco. He reports current alcohol use. He reports that he does not use drugs.  ROS: All other review of  systems were reviewed and are negative except what is noted above in HPI  Physical Exam: BP 134/63   Pulse 60   Temp 97.9 F (36.6 C)   Ht 5\' 7"  (1.702 m)   Wt 175 lb (79.4 kg)   BMI 27.41 kg/m   Constitutional:  Alert and oriented, No acute distress. HEENT: [place] AT, moist mucus membranes.  Trachea midline, no masses. Cardiovascular: No clubbing, cyanosis, or edema. Respiratory: Normal respiratory effort, no increased work of breathing. GI: Abdomen is soft, nontender, nondistended, no abdominal masses GU: No CVA tenderness.  Lymph: No cervical or inguinal lymphadenopathy. Skin: No rashes, bruises or suspicious lesions. Neurologic: Grossly intact, no focal deficits, moving all 4 extremities. Psychiatric: Normal mood and affect.  Laboratory Data: Lab Results  Component Value Date   WBC 3.7 (L) 07/13/2017   HGB 13.9 07/13/2017   HCT 42.6 07/13/2017   MCV 96.2 07/13/2017   PLT 151.0 07/13/2017    Lab Results  Component Value Date   CREATININE 1.14 07/13/2017    No results found for: PSA  No results found for: TESTOSTERONE  No results found for: HGBA1C  Urinalysis    Component Value Date/Time   COLORURINE YELLOW 07/13/2017 [place] 07/13/2017 0948   LABSPEC 1.025 08/17/2020 1556   LABSPEC 1.010 07/16/2014 0916   PHURINE 5.5 08/17/2020 1556  GLUCOSEU NEGATIVE 08/17/2020 [place] 07/13/2017 0948   GLUCOSEU Negative 07/16/2014 0916   HGBUR NEGATIVE 08/17/2020 1556   BILIRUBINUR NEGATIVE 08/17/2020 1556   BILIRUBINUR negative 12/20/2016 1047   BILIRUBINUR Negative 07/16/2014 0916   KETONESUR NEGATIVE 08/17/2020 1556   PROTEINUR NEGATIVE 08/17/2020 1556   UROBILINOGEN 0.2 08/17/2020 1556   UROBILINOGEN 0.2 07/16/2014 0916   NITRITE NEGATIVE 08/17/2020 1556   LEUKOCYTESUR NEGATIVE 08/17/2020 1556   LEUKOCYTESUR Negative 07/16/2014 0916    Lab Results  Component Value Date   BACTERIA Negative 07/16/2014    Pertinent  Imaging:  No results found for this or any previous visit.  No results found for this or any previous visit.  No results found for this or any previous visit.  No results found for this or any previous visit.  No results found for this or any previous visit.  No results found for this or any previous visit.  No results found for this or any previous visit.  No results found for this or any previous visit.   Assessment & Plan:    1. Prostate cancer ([place]) -PSA today - Urinalysis, Routine w reflex microscopic  2. Erectile dysfunction -We will trial levitra 20mg   3. BPH with LUTS, weak stream -We will restart flomax 0.4mg  and finasteride  No follow-ups on file.  Nicolette Bang, MD  [facility] [place]

## 2020-11-16 NOTE — Patient Instructions (Signed)

## 2020-11-16 NOTE — Progress Notes (Signed)
Urological Symptom Review  Patient is experiencing the following symptoms: Hard to postpone urination Burning/pain with urination Blood in urine Weak stream Erection problems (male only)   Review of Systems  Gastrointestinal (upper)  : negative  Gastrointestinal (lower) : Negative for lower GI symptoms  Constitutional : Negative for symptoms  Skin: Negative for skin symptoms  Eyes: Blurred vision  Ear/Nose/Throat : Negative for Ear/Nose/Throat symptoms  Hematologic/Lymphatic: Negative for Hematologic/Lymphatic symptoms  Cardiovascular : Negative for cardiovascular symptoms  Respiratory : Negative for respiratory symptoms  Endocrine: Negative for endocrine symptoms  Musculoskeletal: Negative for musculoskeletal symptoms  Neurological: Negative for neurological symptoms  Psychologic: Negative for psychiatric symptoms

## 2020-11-17 ENCOUNTER — Other Ambulatory Visit: Payer: Self-pay

## 2020-11-17 ENCOUNTER — Encounter (HOSPITAL_COMMUNITY): Payer: Self-pay

## 2020-11-17 ENCOUNTER — Emergency Department (HOSPITAL_COMMUNITY)
Admission: EM | Admit: 2020-11-17 | Discharge: 2020-11-17 | Disposition: A | Discharge status: Home / Self Care | Payer: Worker's Compensation | Attending: Emergency Medicine | Admitting: Emergency Medicine

## 2020-11-17 DIAGNOSIS — Y9389 Activity, other specified: Secondary | ICD-10-CM | POA: Insufficient documentation

## 2020-11-17 DIAGNOSIS — M25521 Pain in right elbow: Secondary | ICD-10-CM | POA: Diagnosis present

## 2020-11-17 DIAGNOSIS — M25511 Pain in right shoulder: Secondary | ICD-10-CM | POA: Insufficient documentation

## 2020-11-17 DIAGNOSIS — Z8546 Personal history of malignant neoplasm of prostate: Secondary | ICD-10-CM | POA: Insufficient documentation

## 2020-11-17 DIAGNOSIS — M25531 Pain in right wrist: Secondary | ICD-10-CM | POA: Diagnosis not present

## 2020-11-17 DIAGNOSIS — M7021 Olecranon bursitis, right elbow: Secondary | ICD-10-CM | POA: Diagnosis not present

## 2020-11-17 LAB — PSA: Prostate Specific Ag, Serum: <0.1 ng/mL (ref 0.0–4.0)

## 2020-11-17 MED ORDER — IBUPROFEN 600 MG PO TABS: 600.0000 mg | ORAL_TABLET | Freq: Four times a day (QID) | ORAL | 0 refills | Status: AC | PRN

## 2020-11-17 NOTE — Discharge Instructions (Signed)
Take the medications as needed for pain.  Follow-up with an orthopedic doctor for further evaluation of your symptoms

## 2020-11-17 NOTE — ED Notes (Signed)
Pt reports he fell on his job from a 3-4 ft ladder and landed on his right side in the last week of October injuring his right shoulder and elbow. Pt states he though it would get better, but it hasn't. Pt has 2+ swelling of right elbow. Right elbow is soft to touch and mobile. Pt denies numbness and tingling. Pt has 2+ right radial pulse, cap refill less than 3 sec, sensation intact. Pt states unable to bend hand backwards due to pain in the right wrist.

## 2020-11-17 NOTE — ED Triage Notes (Signed)
Pt report same from 1/4. He states that he had an x-ray on the 4th.Pt reports he left w/o seeing a MD/PA. Pt reports he needs d/c paperwork and work note due to arm still hurting.

## 2020-11-17 NOTE — ED Provider Notes (Signed)
[facility] EMERGENCY DEPARTMENT Provider Note   CSN: [id number] Arrival date & time: 11/17/20  1348     History Chief Complaint  Patient presents with  . Elbow Pain    Marvin Parker is a 75 y.o. male.  HPI   Patient presents to the ED for evaluation of persistent pain in his shoulder elbow and wrist.  Patient states this has been going on since October.  He did have a fall earlier this month that seem to exacerbate those symptoms.  He denies any trouble with fevers or chills.  No vomiting or diarrhea.  No numbness or weakness.  He has noticed some swelling around his right elbow.  Patient was in the emergency room a couple days after that fall.  He had x-rays of his right shoulder elbow and wrist.  There is no evidence of fracture although there was degenerative changes in his [facility] joint He also had small amount of soft tissue swelling around the elbow.. Past Medical History:  Diagnosis Date  . History of radiation therapy 07/07/14-08/29/14   prostate 7605 cGy 39 sessions  . 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 Lower extremity edema 07/13/2017  . Erectile dysfunction 12/20/2016  . Dysuria 07/15/2014  . Malignant neoplasm of prostate ([place]) 04/23/2014  . ABDOMINAL PAIN, PERIUMBILIC 69/67/8938    Past Surgical History:  Procedure Laterality Date  . CIRCUMCISION    . PROSTATE BIOPSY  02/17/14   gleason 3+3=6, 91.34 cc       Family History  Problem Relation Age of Onset  . Hypertension Father     Social History   Tobacco Use  . Smoking status: Never Smoker  . Smokeless tobacco: Never Used  Vaping Use  . Vaping Use: Never used  Substance Use Topics  . Alcohol use: Yes    Comment: Once a month  . Drug use: No    Home Medications Prior to Admission medications   Medication Sig Start Date End Date Taking? Authorizing Provider  ibuprofen (ADVIL) 600 MG tablet Take 1  tablet (600 mg total) by mouth every 6 (six) hours as needed. 11/17/20  Yes Dorie Rank, MD  acetaminophen (TYLENOL) 100 MG/ML solution Take 10 mg/kg by mouth every 4 (four) hours as needed for fever. Patient not taking: Reported on 09/30/2020    [provider]  alfuzosin (UROXATRAL) 10 MG 24 hr tablet Take 1 tablet (10 mg total) by mouth at bedtime. 09/30/20   McKenzie, Candee Furbish, MD  finasteride (PROSCAR) 5 MG tablet Take 1 tablet (5 mg total) by mouth daily. 11/16/20   McKenzie, Candee Furbish, MD  sildenafil (VIAGRA) 100 MG tablet Take 1 tablet (100 mg total) by mouth daily as needed for erectile dysfunction. 09/30/20   McKenzie, Candee Furbish, MD  tamsulosin (FLOMAX) 0.4 MG CAPS capsule Take 1 capsule (0.4 mg total) by mouth daily. 11/16/20   McKenzie, Candee Furbish, MD  vardenafil (LEVITRA) 20 MG tablet Take 1 tablet (20 mg total) by mouth daily as needed for erectile dysfunction. 11/16/20   McKenzie, Candee Furbish, MD    Allergies    Patient has no known allergies.  Review of Systems   Review of Systems  All other systems reviewed and are negative.   Physical Exam Updated Vital Signs BP (!) 164/91   Pulse 62   Temp 97.8 F (36.6 C) (Oral)   Resp 18  SpO2 100%   Physical Exam Vitals and nursing note reviewed.  Constitutional:      General: He is not in acute distress.    Appearance: He is well-developed.  HENT:     Head: Normocephalic and atraumatic.     Right Ear: External ear normal.     Left Ear: External ear normal.  Eyes:     General: No scleral icterus.       Right eye: No discharge.        Left eye: No discharge.     Conjunctiva/sclera: Conjunctivae normal.  Neck:     Trachea: No tracheal deviation.  Cardiovascular:     Rate and Rhythm: Normal rate.  Pulmonary:     Effort: Pulmonary effort is normal. No respiratory distress.     Breath sounds: No stridor.  Abdominal:     General: There is no distension.  Musculoskeletal:        General: Swelling present. No  deformity.     Cervical back: Neck supple.     Comments: Olecranon bursa swelling, no erythema, full range of motion of elbow and wrist, full range of motion shoulder.  Skin:    General: Skin is warm and dry.     Findings: No rash.  Neurological:     Mental Status: He is alert.     Cranial Nerves: Cranial nerve deficit: no gross deficits.     ED Results / Procedures / Treatments   Labs (all labs ordered are listed, but only abnormal results are displayed) Labs Reviewed - No data to display  EKG None  Radiology No results found.  Procedures Procedures (including critical care time)  Medications Ordered in ED Medications - No data to display  ED Course  I have reviewed the triage vital signs and the nursing notes.  Pertinent labs & imaging results that were available during my care of the patient were reviewed by me and considered in my medical decision making (see chart for details).    MDM Rules/Calculators/A&P                          Patient's prior x-rays reviewed.  No signs of fracture.  Patient does have some arthritis findings and findings suggestive of an olecranon bursitis.  Doubt infection.  Will discharge home with prescription for NSAIDs.  Outpatient follow-up with orthopedics. Final Clinical Impression(s) / ED Diagnoses Final diagnoses:  Olecranon bursitis of right elbow    Rx / DC Orders ED Discharge Orders         Ordered    ibuprofen (ADVIL) 600 MG tablet  Every 6 hours PRN        11/17/20 1623           Dorie Rank, MD 11/17/20 1630

## 2020-11-30 NOTE — Progress Notes (Signed)
Results mailed 

## 2020-12-01 ENCOUNTER — Encounter: Payer: Self-pay | Admitting: Orthopaedic Surgery

## 2020-12-01 ENCOUNTER — Ambulatory Visit (INDEPENDENT_AMBULATORY_CARE_PROVIDER_SITE_OTHER): Payer: Medicare (Managed Care) | Admitting: Orthopaedic Surgery

## 2020-12-01 DIAGNOSIS — M7021 Olecranon bursitis, right elbow: Secondary | ICD-10-CM

## 2020-12-01 DIAGNOSIS — M25521 Pain in right elbow: Secondary | ICD-10-CM

## 2020-12-01 NOTE — Progress Notes (Signed)
Office Visit Note   Patient: Marvin Parker           Date of Birth: 05/11/1946           MRN: [id number] Visit Date: 12/01/2020              Requested by: [address], [address],  [place] [address] PCP: Golden Circle, FNP   Assessment & Plan: Visit Diagnoses:  1. Pain in right elbow   2. Olecranon bursitis, right elbow     Plan: The patient signs and symptoms are consistent with olecranon bursitis.  I was able to aspirate about 7 cc of fluid from the olecranon area of the right elbow to completely decompress the area.  I then placed a cc of Depo-Medrol/steroid around the soft tissue in this area.  I placed a compressive dressing which she will remove at lunchtime today.  All questions and concerns were answered and addressed.  I gave him a note to keep him out of work this week but he can return to full work duties starting next Monday.  Follow-up is as needed.  If this swells again and needs a repeat aspiration he will let us know and get a follow-up appointment.  Follow-Up Instructions: Return if symptoms worsen or fail to improve.   Orders:  Orders Placed This Encounter  Procedures  . Hand/UE Inj   No orders of the defined types were placed in this encounter.     Procedures: Hand/UE Inj on 12/01/2020 9:20 AM      Clinical Data: No additional findings.   Subjective: Chief Complaint  Patient presents with  . Right Elbow - Pain  The patient is a 75 year old gentleman I am seeing for the first time.  He comes in for evaluation treatment of a right elbow contusion with associated olecranon bursitis.  He fell earlier this month onto the right dominant elbow after coming down a ladder.  He has been taking just ibuprofen for pain.  He denies any fever and chills and denies any redness to the elbow.  He has been able to use his arm as well.  He is never injured this before or had a fluid collection around his right elbow  before.  HPI  Review of Systems He currently denies any headache, chest pain, shortness of breath, fever, chills, nausea, vomiting  Objective: Vital Signs: There were no vitals taken for this visit.  Physical Exam He is alert and oriented x3 and in no acute distress Ortho Exam Examination of his right elbow shows full range of motion.  The elbow is ligamentously stable.  There is a fluid collection and some pain over the olecranon bursa area.  There is no redness and no open wounds. Specialty Comments:  No specialty comments available.  Imaging: No results found. X-rays on the canopy system of the right elbow are reviewed and show soft tissue fluid collection with swelling.  There is spurring at the tip of the olecranon.  PMFS History: Patient Active Problem List   Diagnosis Date Noted  . Lower extremity edema 07/13/2017  . Erectile dysfunction 12/20/2016  . Dysuria 07/15/2014  . Malignant neoplasm of prostate ([place]) 04/23/2014  . ABDOMINAL PAIN, PERIUMBILIC 50/38/8828   Past Medical History:  Diagnosis Date  . History of radiation therapy 07/07/14-08/29/14   prostate 7605 cGy 39 sessions  . 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 Hypertension Father     Past Surgical History:  Procedure Laterality Date  . CIRCUMCISION    . PROSTATE BIOPSY  02/17/14   gleason 3+3=6, 91.34 cc   Social History   Occupational History  . Occupation: Warehouse  Tobacco Use  . Smoking status: Never Smoker  . Smokeless tobacco: Never Used  Vaping Use  . Vaping Use: Never used  Substance and Sexual Activity  . Alcohol use: Yes    Comment: Once a month  . Drug use: No  . Sexual activity: Not on file

## 2020-12-30 ENCOUNTER — Ambulatory Visit: Payer: Medicare (Managed Care) | Admitting: Orthopaedic Surgery

## 2021-02-16 ENCOUNTER — Ambulatory Visit (INDEPENDENT_AMBULATORY_CARE_PROVIDER_SITE_OTHER): Payer: Medicare (Managed Care) | Admitting: Orthopaedic Surgery

## 2021-02-16 ENCOUNTER — Encounter: Payer: Self-pay | Admitting: Orthopaedic Surgery

## 2021-02-16 DIAGNOSIS — M7021 Olecranon bursitis, right elbow: Secondary | ICD-10-CM

## 2021-02-16 NOTE — Progress Notes (Signed)
Office Visit Note   Patient: Marvin Parker           Date of Birth: 01-08-1946           MRN: [id number] Visit Date: 02/16/2021              Requested by: [address], [address],  [place] [address] PCP: Golden Circle, FNP   Assessment & Plan: Visit Diagnoses:  1. Olecranon bursitis, right elbow     Plan: I did let him know that he should still have a little bit of pain at the tip of the elbow that should continue to subside with time.  Fortunately there is normal bursitis of the elbow and this is an area that is certainly subject to having some pain due to lack of padding in this area and from having a fall directly on it.  I gave him reassurance that his elbow exam is entirely normal other than some pain at the tip of the olecranon area.  I felt was appropriate to place a steroid injection over the olecranon area and he agreed to this and tolerated it very well.  Follow-up can be as needed.  There is no permanent disability as it relates to his right elbow.  Activities are as tolerated.  Follow-Up Instructions: No follow-ups on file.   Orders:  Orders Placed This Encounter  Procedures  . Hand/UE Inj   No orders of the defined types were placed in this encounter.     Procedures: No procedures performed   Clinical Data: No additional findings.   Subjective: Chief Complaint  Patient presents with  . Right Elbow - Follow-up  The patient is on I saw first about 2 and half months ago.  In January he had a mechanical fall off a ladder injuring his right elbow.  There was no fracture and when I saw him in the office he had symptoms consistent with olecranon bursitis that can flareup after a fall landing on elbow.  We aspirated fluid from the elbow olecranon area.  He comes today stating he still has a little bit of tenderness to the elbow but it seems to be much better as far as the swelling.  He is 74 years old.  HPI  Review of  Systems There is no listed headache, chest pain, shortness of breath, fever, chills, nausea, vomiting  Objective: Vital Signs: There were no vitals taken for this visit.  Physical Exam He is alert and orient x3 and in no acute distress Ortho Exam Examination of his right elbow shows only pain to palpation of the tip olecranon.  There is no redness.  There is no swelling.  His range of motion of the elbow is entirely full and his elbow is ligamentously stable. Specialty Comments:  No specialty comments available.  Imaging: No results found.   PMFS History: Patient Active Problem List   Diagnosis Date Noted  . Lower extremity edema 07/13/2017  . Erectile dysfunction 12/20/2016  . Dysuria 07/15/2014  . Malignant neoplasm of prostate ([place]) 04/23/2014  . ABDOMINAL PAIN, PERIUMBILIC 20/35/5974   Past Medical History:  Diagnosis Date  . History of radiation therapy 07/07/14-08/29/14   prostate 7605 cGy 39 sessions  . 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 Hypertension Father     Past Surgical History:  Procedure Laterality Date  . CIRCUMCISION    .  PROSTATE BIOPSY  02/17/14   gleason 3+3=6, 91.34 cc   Social History   Occupational History  . Occupation: Warehouse  Tobacco Use  . Smoking status: Never Smoker  . Smokeless tobacco: Never Used  Vaping Use  . Vaping Use: Never used  Substance and Sexual Activity  . Alcohol use: Yes    Comment: Once a month  . Drug use: No  . Sexual activity: Not on file

## 2021-02-18 ENCOUNTER — Emergency Department (HOSPITAL_COMMUNITY)
Admission: EM | Admit: 2021-02-18 | Discharge: 2021-02-18 | Disposition: A | Discharge status: Home / Self Care | Payer: Medicare (Managed Care) | Attending: Emergency Medicine | Admitting: Emergency Medicine

## 2021-02-18 ENCOUNTER — Encounter (HOSPITAL_COMMUNITY): Payer: Self-pay

## 2021-02-18 DIAGNOSIS — R42 Dizziness and giddiness: Secondary | ICD-10-CM | POA: Insufficient documentation

## 2021-02-18 DIAGNOSIS — Z8546 Personal history of malignant neoplasm of prostate: Secondary | ICD-10-CM | POA: Insufficient documentation

## 2021-02-18 DIAGNOSIS — R040 Epistaxis: Secondary | ICD-10-CM | POA: Insufficient documentation

## 2021-02-18 DIAGNOSIS — I959 Hypotension, unspecified: Secondary | ICD-10-CM | POA: Insufficient documentation

## 2021-02-18 LAB — CBC WITH DIFFERENTIAL/PLATELET
Abs Immature Granulocytes: 0.04 10*3/uL (ref 0.00–0.07)
Basophils Absolute: 0 10*3/uL (ref 0.0–0.1)
Basophils Relative: 1 %
Eosinophils Absolute: 0.1 10*3/uL (ref 0.0–0.5)
Eosinophils Relative: 2 %
HCT: 38.8 % — ABNORMAL LOW (ref 39.0–52.0)
Hemoglobin: 13 g/dL (ref 13.0–17.0)
Immature Granulocytes: 1 %
Lymphocytes Relative: 30 %
Lymphs Abs: 1.5 10*3/uL (ref 0.7–4.0)
MCH: 32 pg (ref 26.0–34.0)
MCHC: 33.5 g/dL (ref 30.0–36.0)
MCV: 95.6 fL (ref 80.0–100.0)
Monocytes Absolute: 0.5 10*3/uL (ref 0.1–1.0)
Monocytes Relative: 10 %
Neutro Abs: 2.8 10*3/uL (ref 1.7–7.7)
Neutrophils Relative %: 56 %
Platelets: 147 10*3/uL — ABNORMAL LOW (ref 150–400)
RBC: 4.06 MIL/uL — ABNORMAL LOW (ref 4.22–5.81)
RDW: 13.2 % (ref 11.5–15.5)
WBC: 5 10*3/uL (ref 4.0–10.5)
nRBC: 0 % (ref 0.0–0.2)

## 2021-02-18 LAB — PROTIME-INR
INR: 1.1 (ref 0.8–1.2)
Prothrombin Time: 13.7 seconds (ref 11.4–15.2)

## 2021-02-18 LAB — BASIC METABOLIC PANEL
Anion gap: 8 (ref 5–15)
BUN: 28 mg/dL — ABNORMAL HIGH (ref 8–23)
CO2: 22 mmol/L (ref 22–32)
Calcium: 8.9 mg/dL (ref 8.9–10.3)
Chloride: 108 mmol/L (ref 98–111)
Creatinine, Ser: 1.14 mg/dL (ref 0.61–1.24)
GFR, Estimated: >60 mL/min (ref >60)
Glucose, Bld: 107 mg/dL — ABNORMAL HIGH (ref 70–99)
Potassium: 3.9 mmol/L (ref 3.5–5.1)
Sodium: 138 mmol/L (ref 135–145)

## 2021-02-18 LAB — TROPONIN I (HIGH SENSITIVITY)
Troponin I (High Sensitivity): 7 ng/L (ref <18)
Troponin I (High Sensitivity): 7 ng/L (ref <18)

## 2021-02-18 MED ORDER — OXYMETAZOLINE HCL 0.05 % NA SOLN: 1.0000 | Freq: Once | NASAL | Status: DC | PRN

## 2021-02-18 MED ORDER — TRANEXAMIC ACID FOR EPISTAXIS
500.0000 mg | Freq: Once | TOPICAL | Status: AC
  Administered 2021-02-18: 500 mg via TOPICAL
  Filled 2021-02-18 (×2): qty 10

## 2021-02-18 MED ORDER — SILVER NITRATE-POT NITRATE 75-25 % EX MISC
1.0000 | Freq: Once | CUTANEOUS | Status: DC | PRN
  Filled 2021-02-18: qty 1

## 2021-02-18 MED ORDER — LIDOCAINE HCL 2 % EX GEL
1.0000 "application " | Freq: Once | CUTANEOUS | Status: DC | PRN
  Filled 2021-02-18: qty 4250

## 2021-02-18 MED ORDER — LIDOCAINE HCL 4 % EX SOLN
0.0000 mL | Freq: Once | CUTANEOUS | Status: DC | PRN
  Filled 2021-02-18: qty 50

## 2021-02-18 MED ORDER — BACITRACIN-NEOMYCIN-POLYMYXIN OINTMENT TUBE
1.0000 "application " | TOPICAL_OINTMENT | Freq: Once | CUTANEOUS | Status: DC | PRN
  Filled 2021-02-18: qty 14

## 2021-02-18 MED ORDER — OXYMETAZOLINE HCL 0.05 % NA SOLN
1.0000 | Freq: Once | NASAL | Status: AC
  Administered 2021-02-18: 1 via NASAL
  Filled 2021-02-18: qty 30

## 2021-02-18 MED ORDER — LIDOCAINE-EPINEPHRINE 1 %-1:100000 IJ SOLN: 0.0000 mL | Freq: Once | INTRAMUSCULAR | Status: DC | PRN

## 2021-02-18 NOTE — ED Triage Notes (Signed)
BIBA from home. Pt was originally seen for epistaxis, EMS called back out after pt had syncopal episode. Upon EMS arrival HR in 40's w/ BP in 60's. Given 1L NS bolus pressure and heart rate stabilized.

## 2021-02-18 NOTE — ED Notes (Signed)
Pt d/c home per MD order. Discharge summary reviewed with pt, pt verbalizes understanding. Taxi called for pt per his request for discharge ride home. No s/s of acute distress noted at discharge,

## 2021-02-18 NOTE — ED Provider Notes (Signed)
[facility] EMERGENCY DEPARTMENT Provider Note   CSN: [id number] Arrival date & time: 02/18/21  0358     History Chief Complaint  Patient presents with  . Hypotension    Marvin Parker is a 75 y.o. male.  Patient brought in by EMS with nosebleed.  He called EMS for epistaxis that he woke with around 2 AM.  Is been having intermittent left-sided nosebleed for the past 2 days.  Does not take any blood thinners.  On EMS arrival patient was found to be bradycardic and hypotensive in the 60s with a heart rate in the 40s.  Patient states he felt a little dizzy at the time but now feels back to normal.  He was given 1 L bolus of fluids and now feels back to baseline with a heart rate of 57 and a blood pressure 139/72.  He does not take any rate control medications or blood pressure medications. Denies any chest pain or shortness of breath.  Denies any dizziness or lightheadedness.  Denies any fevers, chills, nausea or vomiting.  No blood thinner use  The history is provided by the patient.       Past Medical History:  Diagnosis Date  . History of radiation therapy 07/07/14-08/29/14   prostate 7605 cGy 39 sessions  . 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 Lower extremity edema 07/13/2017  . Erectile dysfunction 12/20/2016  . Dysuria 07/15/2014  . Malignant neoplasm of prostate ([place]) 04/23/2014  . ABDOMINAL PAIN, PERIUMBILIC 29/79/8921    Past Surgical History:  Procedure Laterality Date  . CIRCUMCISION    . PROSTATE BIOPSY  02/17/14   gleason 3+3=6, 91.34 cc       Family History  Problem Relation Age of Onset  . Hypertension Father     Social History   Tobacco Use  . Smoking status: Never Smoker  . Smokeless tobacco: Never Used  Vaping Use  . Vaping Use: Never used  Substance Use Topics  . Alcohol use: Yes    Comment: Once a month  . Drug use: No    Home  Medications Prior to Admission medications   Medication Sig Start Date End Date Taking? Authorizing Provider  acetaminophen (TYLENOL) 100 MG/ML solution Take 10 mg/kg by mouth every 4 (four) hours as needed for fever. Patient not taking: Reported on 09/30/2020    [provider]  alfuzosin (UROXATRAL) 10 MG 24 hr tablet Take 1 tablet (10 mg total) by mouth at bedtime. 09/30/20   McKenzie, Candee Furbish, MD  finasteride (PROSCAR) 5 MG tablet Take 1 tablet (5 mg total) by mouth daily. 11/16/20   McKenzie, Candee Furbish, MD  ibuprofen (ADVIL) 600 MG tablet Take 1 tablet (600 mg total) by mouth every 6 (six) hours as needed. 11/17/20   Dorie Rank, MD  sildenafil (VIAGRA) 100 MG tablet Take 1 tablet (100 mg total) by mouth daily as needed for erectile dysfunction. 09/30/20   McKenzie, Candee Furbish, MD  tamsulosin (FLOMAX) 0.4 MG CAPS capsule Take 1 capsule (0.4 mg total) by mouth daily. 11/16/20   McKenzie, Candee Furbish, MD  vardenafil (LEVITRA) 20 MG tablet Take 1 tablet (20 mg total) by mouth daily as needed for erectile dysfunction. 11/16/20   McKenzie, Candee Furbish, MD    Allergies    Patient has no known allergies.  Review of Systems   Review of Systems  Constitutional: Negative for  activity change, appetite change, fatigue and fever.  HENT: Positive for nosebleeds.   Respiratory: Negative for cough, chest tightness and shortness of breath.   Cardiovascular: Negative for chest pain.  Gastrointestinal: Negative for nausea and vomiting.  Genitourinary: Negative for dysuria and hematuria.  Musculoskeletal: Negative for arthralgias and myalgias.  Skin: Negative for rash.  Neurological: Positive for dizziness, syncope and light-headedness. Negative for headaches.   all other systems are negative except as noted in the HPI and PMH.    Physical Exam Updated Vital Signs BP 139/72 (BP Location: Left Arm)   Pulse (!) 57   Temp 97.8 F (36.6 C) (Oral)   Resp 18   Ht 5\' 7"  (1.702 m)   Wt 79.4 kg    SpO2 96%   BMI 27.41 kg/m   Physical Exam Vitals and nursing note reviewed.  Constitutional:      General: He is not in acute distress.    Appearance: He is well-developed.  HENT:     Head: Normocephalic and atraumatic.     Nose:     Comments: Dried blood in bilateral nares.  No active bleeding.  No septal hematoma.    Mouth/Throat:     Pharynx: No oropharyngeal exudate.     Comments: Streak of blood in posterior pharynx Eyes:     Conjunctiva/sclera: Conjunctivae normal.     Pupils: Pupils are equal, round, and reactive to light.  Neck:     Comments: No meningismus. Cardiovascular:     Rate and Rhythm: Normal rate and regular rhythm.     Heart sounds: Normal heart sounds. No murmur heard.   Pulmonary:     Effort: Pulmonary effort is normal. No respiratory distress.     Breath sounds: Normal breath sounds.  Abdominal:     Palpations: Abdomen is soft.     Tenderness: There is no abdominal tenderness. There is no guarding or rebound.  Musculoskeletal:        General: No tenderness. Normal range of motion.     Cervical back: Normal range of motion and neck supple.  Skin:    General: Skin is warm.  Neurological:     Mental Status: He is alert and oriented to person, place, and time.     Cranial Nerves: No cranial nerve deficit.     Motor: No abnormal muscle tone.     Coordination: Coordination normal.     Comments: No ataxia on finger to nose bilaterally. No pronator drift. 5/5 strength throughout. CN 2-12 intact.Equal grip strength. Sensation intact.   Psychiatric:        Behavior: Behavior normal.     ED Results / Procedures / Treatments   Labs (all labs ordered are listed, but only abnormal results are displayed) Labs Reviewed  CBC WITH DIFFERENTIAL/PLATELET - Abnormal; Notable for the following components:      Result Value   RBC 4.06 (*)    HCT 38.8 (*)    Platelets 147 (*)    All other components within normal limits  BASIC METABOLIC PANEL - Abnormal;  Notable for the following components:   Glucose, Bld 107 (*)    BUN 28 (*)    All other components within normal limits  PROTIME-INR  TROPONIN I (HIGH SENSITIVITY)  TROPONIN I (HIGH SENSITIVITY)    EKG EKG Interpretation  Date/Time:  Thursday February 18 2021 04:02:55 EDT Ventricular Rate:  58 PR Interval:  173 QRS Duration: 92 QT Interval:  409 QTC Calculation: 402 R Axis:   32  Text Interpretation: Sinus rhythm Borderline T wave abnormalities No previous ECGs available Confirmed by Ezequiel Essex [phone]) on 02/18/2021 4:37:35 AM   Radiology No results found.  Procedures .Epistaxis Management  Date/Time: 02/18/2021 5:14 AM Performed by: Ezequiel Essex, MD Authorized by: Ezequiel Essex, MD   Consent:    Consent obtained:  Verbal   Consent given by:  Patient   Risks, benefits, and alternatives were discussed: yes     Risks discussed:  Bleeding, infection, nasal injury and pain   Alternatives discussed:  No treatment Universal protocol:    Procedure explained and questions answered to patient or proxy's satisfaction: yes     Relevant documents present and verified: yes     Test results available: yes     Patient identity confirmed:  Verbally with patient and arm band Anesthesia:    Anesthesia method:  None Procedure details:    Treatment site:  R anterior   Repair method: TXA, afrin.   Treatment complexity:  Limited   Treatment episode: recurring   Post-procedure details:    Assessment:  Bleeding stopped   Procedure completion:  Tolerated well, no immediate complications     Medications Ordered in ED Medications  oxymetazoline (AFRIN) 0.05 % nasal spray 1 spray (has no administration in time range)  tranexamic acid (CYKLOKAPRON) 1000 MG/10ML topical solution 500 mg (has no administration in time range)  lidocaine-EPINEPHrine (XYLOCAINE W/EPI) 1 %-1:100000 (with pres) injection 0-30 mL (has no administration in time range)  lidocaine (XYLOCAINE) 2 % jelly 1  application (has no administration in time range)  lidocaine (XYLOCAINE) 4 % external solution 0-50 mL (has no administration in time range)  silver nitrate applicators applicator 1 Stick (has no administration in time range)  oxymetazoline (AFRIN) 0.05 % nasal spray 1 spray (has no administration in time range)  neomycin-bacitracin-polymyxin (NEOSPORIN) ointment 1 application (has no administration in time range)    ED Course  I have reviewed the triage vital signs and the nursing notes.  Pertinent labs & imaging results that were available during my care of the patient were reviewed by me and considered in my medical decision making (see chart for details).    MDM Rules/Calculators/A&P                         Intermittent nosebleed for the past 2 days.  No blood thinner use.  Syncopal episode for EMS with bradycardia and hypotension have resolved.  EKG on arrival sinus rhythm at 58. No prolonged QT, no Brugada.   Bleeding controlled with topical TXA as well as Afrin and lidocaine.  Large clot evacuated from left nare. Observed without any recurrence of bleeding.  Heart rate has remained stable in the 50s.  Patient has no dizziness or lightheadedness.  No chest pain or shortness of breath.  No evidence of high degree AV block. Blood pressure has remained 150s to 160s.  Suspect likely vasovagal syncope in setting of blood loss now resolved.  Low suspicion for ACS, pulmonary embolism, malignant arrhythmia. [place] syncope rule negative.  FAINT score 0.  No recurrence of epistaxis throughout monitoring in ED.  Follow-up with PCP as well as ENT.  Return precautions discussed. Final Clinical Impression(s) / ED Diagnoses Final diagnoses:  Epistaxis    Rx / DC Orders ED Discharge Orders    None       Kealani Leckey, Annie Main, MD 02/18/21 [phone]

## 2021-02-18 NOTE — Discharge Instructions (Signed)
Keep yourself hydrated.  If your nosebleed recurs, hold pressure for 20 to 30 minutes.  Follow-up with the ear nose and throat doctor. Return to the ED with new or worsening symptoms

## 2021-05-17 ENCOUNTER — Ambulatory Visit (INDEPENDENT_AMBULATORY_CARE_PROVIDER_SITE_OTHER): Payer: Medicare (Managed Care)

## 2021-05-17 ENCOUNTER — Ambulatory Visit: Payer: Medicare (Managed Care) | Admitting: Physician Assistant

## 2021-05-17 ENCOUNTER — Encounter: Payer: Self-pay | Admitting: Physician Assistant

## 2021-05-17 ENCOUNTER — Other Ambulatory Visit: Payer: Self-pay

## 2021-05-17 DIAGNOSIS — M25561 Pain in right knee: Secondary | ICD-10-CM

## 2021-05-17 DIAGNOSIS — M25551 Pain in right hip: Secondary | ICD-10-CM | POA: Diagnosis not present

## 2021-05-17 MED ORDER — METHYLPREDNISOLONE ACETATE 40 MG/ML IJ SUSP
40.0000 mg | INTRAMUSCULAR | Status: AC | PRN
Start: 2021-05-17 — End: 2021-05-17
  Administered 2021-05-17: 40 mg via INTRA_ARTICULAR

## 2021-05-17 MED ORDER — LIDOCAINE HCL 1 % IJ SOLN
3.0000 mL | INTRAMUSCULAR | Status: AC | PRN
Start: 2021-05-17 — End: 2021-05-17
  Administered 2021-05-17: 3 mL

## 2021-05-17 MED ORDER — LIDOCAINE HCL 1 % IJ SOLN
3.0000 mL | INTRAMUSCULAR | Status: AC | PRN
  Administered 2021-05-17: 3 mL

## 2021-05-17 MED ORDER — METHYLPREDNISOLONE ACETATE 40 MG/ML IJ SUSP
40.0000 mg | INTRAMUSCULAR | Status: AC | PRN
  Administered 2021-05-17: 40 mg via INTRA_ARTICULAR

## 2021-05-17 NOTE — Progress Notes (Signed)
Office Visit Note   Patient: Marvin Parker           Date of Birth: 10-Jul-1946           MRN: [id number] Visit Date: 05/17/2021              Requested by: No referring provider defined for this encounter. PCP: No primary care provider on file.   Assessment & Plan: Visit Diagnoses:  1. Right knee pain, unspecified chronicity   2. Pain in right hip     Plan: He shown IT band stretching exercises.  He will work on Forensic scientist exercises.  We will see him back in 2 weeks to see what type of response he had to the injections and the exercise.  He may need work-up of his lumbar spine if he continues to have pain down the side that is not improved with the injections he received today and the exercises.  Follow-Up Instructions: Return in about 2 weeks (around 05/31/2021).   Orders:  Orders Placed This Encounter  Procedures   Large Joint Inj   Large Joint Inj   XR HIP UNILAT W OR W/O PELVIS 2-3 VIEWS RIGHT   XR Knee 1-2 Views Right   No orders of the defined types were placed in this encounter.     Procedures: Large Joint Inj: R knee on 05/17/2021 12:12 PM Indications: pain Details: 22 G 1.5 in needle, anterolateral approach  Arthrogram: No  Medications: 3 mL lidocaine 1 %; 40 mg methylPREDNISolone acetate 40 MG/ML Outcome: tolerated well, no immediate complications Procedure, treatment alternatives, risks and benefits explained, specific risks discussed. Consent was given by the patient. Immediately prior to procedure a time out was called to verify the correct patient, procedure, equipment, support staff and site/side marked as required. Patient was prepped and draped in the usual sterile fashion.    Large Joint Inj: R greater trochanter on 05/17/2021 12:13 PM Indications: pain Details: 22 G 1.5 in needle, lateral approach  Arthrogram: No  Medications: 3 mL lidocaine 1 %; 40 mg methylPREDNISolone acetate 40 MG/ML Outcome: tolerated well, no immediate  complications Procedure, treatment alternatives, risks and benefits explained, specific risks discussed. Consent was given by the patient. Immediately prior to procedure a time out was called to verify the correct patient, procedure, equipment, support staff and site/side marked as required. Patient was prepped and draped in the usual sterile fashion.      Clinical Data: No additional findings.   Subjective: Chief Complaint  Patient presents with   Right Hip - Pain   Right Knee - Pain    HPI Mr. Eisenberg comes in today for new complaint of right hip pain and right knee pain. Ongoing for the past 4 months.  States it could be due to to falling off a ladder some 4 months ago which he injured his elbow.  He states he has pain when lying on the right hip.  Denies any groin pain denies any numbness tingling down the leg.  He does have right knee pain on driving points to the lateral aspect of the knee.  He takes Tylenol as needed.  Knee pain.  He does note some giving way of the right knee.  No locking catching painful popping or swelling.  He denies any back pain.  Review of Systems  See HPI  Objective: Vital Signs: There were no vitals taken for this visit.  Physical Exam Constitutional:      Appearance: He is not ill-appearing or  diaphoretic.  Pulmonary:     Effort: Pulmonary effort is normal.  Neurological:     Mental Status: He is alert and oriented to person, place, and time.  Psychiatric:        Behavior: Behavior normal.    Ortho Exam Bilateral hips good range of motion of both hips.  External rotation of the right hip causes discomfort.  He has tenderness over the right hip trochanteric region.  Straight leg raise is negative bilaterally.  Good range of motion of both knees.  No abnormal warmth erythema or effusion of either knee.  No instability valgus varus stressing of either knee.  McMurray's is negative bilaterally.  Tenderness along the lateral joint line of the right  knee. Specialty Comments:   No specialty comments available.  Imaging: XR HIP UNILAT W OR W/O PELVIS 2-3 VIEWS RIGHT  Result Date: 05/17/2021 Ap pelvis and right : No acute fractures.  Bilateral hips well located.  Benign appearing lesion remains unchanged from previous films from 10 years ago in the intertrochanteric region of the right hip.  Otherwise no bony abnormalities.  Degenerative changes are noted in the lower lumbar spine.  XR Knee 1-2 Views Right  Result Date: 05/17/2021 Right knee 2 views: No acute fracture.  Knee is well located.  Knee joint overall well-preserved.  No bony abnormalities.    PMFS History: Patient Active Problem List   Diagnosis Date Noted   Lower extremity edema 07/13/2017   Erectile dysfunction 12/20/2016   Dysuria 07/15/2014   Malignant neoplasm of prostate ([place]) 04/23/2014   ABDOMINAL PAIN, PERIUMBILIC 16/08/9603   Past Medical History:  Diagnosis Date   History of radiation therapy 07/07/14-08/29/14   prostate 7605 cGy 39 sessions   Prostate cancer ([place]) 02/17/14   Gleason 3+3, volume 91.34 cc    Family History  Problem Relation Age of Onset   Hypertension Father     Past Surgical History:  Procedure Laterality Date   CIRCUMCISION     PROSTATE BIOPSY  02/17/14   gleason 3+3=6, 91.34 cc   Social History   Occupational History   Occupation: Warehouse  Tobacco Use   Smoking status: Never   Smokeless tobacco: Never  Vaping Use   Vaping Use: Never used  Substance and Sexual Activity   Alcohol use: Yes    Comment: Once a month   Drug use: No   Sexual activity: Not on file

## 2021-07-19 ENCOUNTER — Ambulatory Visit: Payer: Medicare (Managed Care) | Admitting: Physician Assistant

## 2021-07-22 ENCOUNTER — Other Ambulatory Visit: Payer: Self-pay

## 2021-07-22 ENCOUNTER — Encounter: Payer: Self-pay | Admitting: Physician Assistant

## 2021-07-22 ENCOUNTER — Ambulatory Visit (INDEPENDENT_AMBULATORY_CARE_PROVIDER_SITE_OTHER): Payer: Medicare (Managed Care) | Admitting: Physician Assistant

## 2021-07-22 DIAGNOSIS — M7631 Iliotibial band syndrome, right leg: Secondary | ICD-10-CM | POA: Diagnosis not present

## 2021-07-22 NOTE — Progress Notes (Signed)
HPI: Marvin Parker returns today status post right knee injection and right trochanteric injection 05/17/2021.  He states that the injections gave him some relief but he still has some pain lateral aspect of the hip down at the knee.  States the pain is sharp and burning pain but no numbness tingling down the leg.  No back pain.  He rates his pain a 6 out of 10 pain at worst.  He has had no recent injuries.  He is having no mechanical symptoms of the right knee.  The pain is mostly lateral aspect of the right leg from the hip down to the knee.  Review of systems see HPI otherwise negative  Physical exam: General well-developed well-nourished male no acute distress.  Ambulates without any assistive device. Lower extremities: He has excellent range of motion bilateral knees without pain no instability valgus varus stressing of either knee.  No abnormal warmth erythema or effusion of the right knee.  Tenderness over the right hip trochanteric region.  Tenderness down the right IT band.  No tenderness over the left trochanteric region.  Excellent range of motion both hips without pain.  5 out of 5 strength throughout lower extremities against resistance.  Impression right IT band syndrome.  Plan we will send him to physical therapy for IT band stretching modalities and home exercise program.  He will follow-up with Dr. Malvin Johns in a month to see how he is doing overall.  Questions were encouraged and answered at length.

## 2021-07-23 NOTE — Addendum Note (Signed)
Addended by: Robyne Peers on: 07/23/2021 08:21 AM   Modules accepted: Orders

## 2021-08-04 ENCOUNTER — Ambulatory Visit: Payer: Medicare (Managed Care) | Attending: Physician Assistant

## 2021-09-02 ENCOUNTER — Ambulatory Visit: Payer: Medicare (Managed Care) | Admitting: Orthopaedic Surgery

## 2021-10-18 ENCOUNTER — Encounter: Payer: Self-pay | Admitting: Urology

## 2021-10-18 ENCOUNTER — Other Ambulatory Visit: Payer: Self-pay

## 2021-10-18 ENCOUNTER — Ambulatory Visit: Payer: Medicare Other | Admitting: Urology

## 2021-10-18 VITALS — BP 134/79 | HR 63

## 2021-10-18 DIAGNOSIS — N5201 Erectile dysfunction due to arterial insufficiency: Secondary | ICD-10-CM

## 2021-10-18 DIAGNOSIS — N401 Enlarged prostate with lower urinary tract symptoms: Secondary | ICD-10-CM

## 2021-10-18 DIAGNOSIS — N138 Other obstructive and reflux uropathy: Secondary | ICD-10-CM

## 2021-10-18 DIAGNOSIS — N4889 Other specified disorders of penis: Secondary | ICD-10-CM | POA: Diagnosis not present

## 2021-10-18 MED ORDER — AMBULATORY NON FORMULARY MEDICATION: 0.2000 mL | 5 refills | Status: DC | PRN

## 2021-10-18 MED ORDER — MIRABEGRON ER 25 MG PO TB24: 25.0000 mg | ORAL_TABLET | Freq: Every day | ORAL | 0 refills | Status: DC

## 2021-10-18 MED ORDER — AMBULATORY NON FORMULARY MEDICATION: 0.2000 mL | 5 refills | Status: AC | PRN

## 2021-10-18 NOTE — Patient Instructions (Signed)
Erectile Dysfunction °Erectile dysfunction (ED) is the inability to get or keep an erection in order to have sexual intercourse. ED is considered a symptom of an underlying disorder and is not considered a disease. ED may include: °Inability to get an erection. °Lack of enough hardness of the erection to allow penetration. °Loss of erection before sex is finished. °What are the causes? °This condition may be caused by: °Physical causes, such as: °Artery problems. This may include heart disease, high blood pressure, atherosclerosis, and diabetes. °Hormonal problems, such as low testosterone. °Obesity. °Nerve problems. This may include back or pelvic injuries, multiple sclerosis, Parkinson's disease, spinal cord injury, and stroke. °Certain medicines, such as: °Pain relievers. °Antidepressants. °Blood pressure medicines and water pills (diuretics). °Cancer medicines. °Antihistamines. °Muscle relaxants. °Lifestyle factors, such as: °Use of drugs such as marijuana, cocaine, or opioids. °Excessive use of alcohol. °Smoking. °Lack of physical activity or exercise. °Psychological causes, such as: °Anxiety or stress. °Sadness or depression. °Exhaustion. °Fear about sexual performance. °Guilt. °What are the signs or symptoms? °Symptoms of this condition include: °Inability to get an erection. °Lack of enough hardness of the erection to allow penetration. °Loss of the erection before sex is finished. °Sometimes having normal erections, but with frequent unsatisfactory episodes. °Low sexual satisfaction in either partner due to erection problems. °A curved penis occurring with erection. The curve may cause pain, or the penis may be too curved to allow for intercourse. °Never having nighttime or morning erections. °How is this diagnosed? °This condition is often diagnosed by: °Performing a physical exam to find other diseases or specific problems with the penis. °Asking you detailed questions about the problem. °Doing tests,  such as: °Blood tests to check for diabetes mellitus or high cholesterol, or to measure hormone levels. °Other tests to check for underlying health conditions. °An ultrasound exam to check for scarring. °A test to check blood flow to the penis. °Doing a sleep study at home to measure nighttime erections. °How is this treated? °This condition may be treated by: °Medicines, such as: °Medicine taken by mouth to help you achieve an erection (oral medicine). °Hormone replacement therapy to replace low testosterone levels. °Medicine that is injected into the penis. Your health care provider may instruct you how to give yourself these injections at home. °Medicine that is delivered with a short applicator tube. The tube is inserted into the opening at the tip of the penis, which is the opening of the urethra. A tiny pellet of medicine is put in the urethra. The pellet dissolves and enhances erectile function. This is also called MUSE (medicated urethral system for erections) therapy. °Vacuum pump. This is a pump with a ring on it. The pump and ring are placed on the penis and used to create pressure that helps the penis become erect. °Penile implant surgery. In this procedure, you may receive: °An inflatable implant. This consists of cylinders, a pump, and a reservoir. The cylinders can be inflated with a fluid that helps to create an erection, and they can be deflated after intercourse. °A semi-rigid implant. This consists of two silicone rubber rods. The rods provide some rigidity. They are also flexible, so the penis can both curve downward in its normal position and become straight for sexual intercourse. °Blood vessel surgery to improve blood flow to the penis. During this procedure, a blood vessel from a different part of the body is placed into the penis to allow blood to flow around (bypass) damaged or blocked blood vessels. °Lifestyle changes,   such as exercising more, losing weight, and quitting smoking. °Follow  these instructions at home: °Medicines ° °Take over-the-counter and prescription medicines only as told by your health care provider. Do not increase the dosage without first discussing it with your health care provider. °If you are using self-injections, do injections as directed by your health care provider. Make sure you avoid any veins that are on the surface of the penis. After giving an injection, apply pressure to the injection site for 5 minutes. °Talk to your health care provider about how to prevent headaches while taking ED medicines. These medicines may cause a sudden headache due to the increase in blood flow in your body. °General instructions °Exercise regularly, as directed by your health care provider. Work with your health care provider to lose weight, if needed. °Do not use any products that contain nicotine or tobacco. These products include cigarettes, chewing tobacco, and vaping devices, such as e-cigarettes. If you need help quitting, ask your health care provider. °Before using a vacuum pump, read the instructions that come with the pump and discuss any questions with your health care provider. °Keep all follow-up visits. This is important. °Contact a health care provider if: °You feel nauseous. °You are vomiting. °You get sudden headaches while taking ED medicines. °You have any concerns about your sexual health. °Get help right away if: °You are taking oral or injectable medicines and you have an erection that lasts longer than 4 hours. If your health care provider is unavailable, go to the nearest emergency room for evaluation. An erection that lasts much longer than 4 hours can result in permanent damage to your penis. °You have severe pain in your groin or abdomen. °You develop redness or severe swelling of your penis. °You have redness spreading at your groin or lower abdomen. °You are unable to urinate. °You experience chest pain or a rapid heartbeat (palpitations) after taking oral  medicines. °These symptoms may represent a serious problem that is an emergency. Do not wait to see if the symptoms will go away. Get medical help right away. Call your local emergency services (911 in the [place] Do not drive yourself to the hospital. °Summary °Erectile dysfunction (ED) is the inability to get or keep an erection during sexual intercourse. °This condition is diagnosed based on a physical exam, your symptoms, and tests to determine the cause. Treatment varies depending on the cause and may include medicines, hormone therapy, surgery, or a vacuum pump. °You may need follow-up visits to make sure that you are using your medicines or devices correctly. °Get help right away if you are taking or injecting medicines and you have an erection that lasts longer than 4 hours. °This information is not intended to replace advice given to you by your health care provider. Make sure you discuss any questions you have with your health care provider. °Document Revised: 01/20/2021 Document Reviewed: 01/20/2021 °Elsevier Patient Education © 2022 [employer] ° °

## 2021-10-18 NOTE — Progress Notes (Signed)
10/18/2021 9:18 AM   Marvin Parker October 18, 1946 [id number]  Referring provider: No referring provider defined for this encounter.  Penile pain and erectile dysfunction   HPI: Marvin Parker is a 75yo here for evaluation of penile pain and erectile dysfunction. He was previously seen by Marvin Parker. IPSS 7 QOL 1. He is on flomax and finasteride.  He has dull penile pain that has been present for several years. No exacerbating/alleviating events. No pain with erections. No worse with urination. He has issues getting and maintaining his erections for 3-4 years. He has tried sildenafil, tadalafil and vardenafil without a good response. We went to [facility] and had a trimix injection which worked well.    PMH: Past Medical History:  Diagnosis Date   History of radiation therapy 07/07/14-08/29/14   prostate 7605 cGy 39 sessions   Prostate cancer ([place]) 02/17/14   Gleason 3+3, volume 91.34 cc    Surgical History: Past Surgical History:  Procedure Laterality Date   CIRCUMCISION     PROSTATE BIOPSY  02/17/14   gleason 3+3=6, 91.34 cc    Home Medications:  Allergies as of 10/18/2021   No Known Allergies      Medication List        Accurate as of October 18, 2021  9:18 AM. If you have any questions, ask your nurse or doctor.          alfuzosin 10 MG 24 hr tablet Commonly known as: UROXATRAL Take 1 tablet (10 mg total) by mouth at bedtime.   finasteride 5 MG tablet Commonly known as: PROSCAR Take 1 tablet (5 mg total) by mouth daily.   fluticasone 50 MCG/ACT nasal spray Commonly known as: FLONASE Place 2 sprays into both nostrils daily.   ibuprofen 600 MG tablet Commonly known as: ADVIL Take 1 tablet (600 mg total) by mouth every 6 (six) hours as needed. What changed: reasons to take this   sildenafil 100 MG tablet Commonly known as: VIAGRA Take 1 tablet (100 mg total) by mouth daily as needed for erectile dysfunction.   tamsulosin 0.4 MG Caps capsule Commonly  known as: FLOMAX Take 1 capsule (0.4 mg total) by mouth daily.   vardenafil 20 MG tablet Commonly known as: LEVITRA Take 1 tablet (20 mg total) by mouth daily as needed for erectile dysfunction.        Allergies: No Known Allergies  Family History: Family History  Problem Relation Age of Onset   Hypertension Father     Social History:  reports that he has never smoked. He has never used smokeless tobacco. He reports current alcohol use. He reports that he does not use drugs.  ROS: All other review of systems were reviewed and are negative except what is noted above in HPI  Physical Exam: BP 134/79   Pulse 63   Constitutional:  Alert and oriented, No acute distress. HEENT: [place] AT, moist mucus membranes.  Trachea midline, no masses. Cardiovascular: No clubbing, cyanosis, or edema. Respiratory: Normal respiratory effort, no increased work of breathing. GI: Abdomen is soft, nontender, nondistended, no abdominal masses GU: No CVA tenderness. Circumcised phallus. No masses/lesions on penis, testis, scrotum. Prostate 40g smooth no nodules no induration.  Lymph: No cervical or inguinal lymphadenopathy. Skin: No rashes, bruises or suspicious lesions. Neurologic: Grossly intact, no focal deficits, moving all 4 extremities. Psychiatric: Normal mood and affect.  Laboratory Data: Lab Results  Component Value Date   WBC 5.0 02/18/2021   HGB 13.0 02/18/2021   HCT 38.8 (  L) 02/18/2021   MCV 95.6 02/18/2021   PLT 147 (L) 02/18/2021    Lab Results  Component Value Date   CREATININE 1.14 02/18/2021    No results found for: PSA  No results found for: TESTOSTERONE  No results found for: HGBA1C  Urinalysis    Component Value Date/Time   COLORURINE YELLOW 07/13/2017 0948   APPEARANCEUR Clear 11/16/2020 1010   LABSPEC 1.025 08/17/2020 1556   LABSPEC 1.010 07/16/2014 0916   PHURINE 5.5 08/17/2020 1556   GLUCOSEU Negative 11/16/2020 1010   GLUCOSEU NEGATIVE 07/13/2017 0948    GLUCOSEU Negative 07/16/2014 0916   HGBUR NEGATIVE 08/17/2020 1556   BILIRUBINUR Negative 11/16/2020 1010   BILIRUBINUR Negative 07/16/2014 0916   KETONESUR NEGATIVE 08/17/2020 1556   PROTEINUR Negative 11/16/2020 1010   PROTEINUR NEGATIVE 08/17/2020 1556   UROBILINOGEN 0.2 08/17/2020 1556   UROBILINOGEN 0.2 07/16/2014 0916   NITRITE Negative 11/16/2020 1010   NITRITE NEGATIVE 08/17/2020 1556   LEUKOCYTESUR Negative 11/16/2020 1010   LEUKOCYTESUR NEGATIVE 08/17/2020 1556   LEUKOCYTESUR Negative 07/16/2014 0916    Lab Results  Component Value Date   LABMICR Comment 11/16/2020   BACTERIA Negative 07/16/2014    Pertinent Imaging:  No results found for this or any previous visit.  No results found for this or any previous visit.  No results found for this or any previous visit.  No results found for this or any previous visit.  No results found for this or any previous visit.  No results found for this or any previous visit.  No results found for this or any previous visit.  No results found for this or any previous visit.   Assessment & Plan:    1. Benign prostatic hyperplasia with urinary obstruction -continue flomax and finasteride - Urinalysis, Routine w reflex microscopic  2. Erectile dysfunction due to arterial insufficiency -We will trial trimix injections. RTC 1-2 weeks   3. Penile pain -Likely related to bladder spasms. We will trial mirabegron 25mg  daily   No follow-ups on file.  Marvin Bang, MD  [facility] [place]

## 2021-10-18 NOTE — Progress Notes (Signed)
Urological Symptom Review  Patient is experiencing the following symptoms: Penile pain (male only)  No urinary issues   Review of Systems  Gastrointestinal (upper)  : Negative for upper GI symptoms  Gastrointestinal (lower) : Negative for lower GI symptoms  Constitutional : Negative for symptoms  Skin: Negative for skin symptoms  Eyes: Negative for eye symptoms  Ear/Nose/Throat : Negative for Ear/Nose/Throat symptoms  Hematologic/Lymphatic: Negative for Hematologic/Lymphatic symptoms  Cardiovascular : Negative for cardiovascular symptoms  Respiratory : Negative for respiratory symptoms  Endocrine: Negative for endocrine symptoms  Musculoskeletal: Negative for musculoskeletal symptoms  Neurological: Negative for neurological symptoms  Psychologic: Negative for psychiatric symptoms

## 2021-10-19 LAB — URINALYSIS, ROUTINE W REFLEX MICROSCOPIC
Bilirubin, UA: NEGATIVE
Glucose, UA: NEGATIVE
Ketones, UA: NEGATIVE
Leukocytes,UA: NEGATIVE
Nitrite, UA: NEGATIVE
Protein,UA: NEGATIVE
RBC, UA: NEGATIVE
Specific Gravity, UA: 1.02 (ref 1.005–1.030)
Urobilinogen, Ur: 0.2 mg/dL (ref 0.2–1.0)
pH, UA: 7 (ref 5.0–7.5)

## 2021-10-20 ENCOUNTER — Ambulatory Visit: Payer: Medicare Other | Admitting: Urology

## 2021-10-20 DIAGNOSIS — R3912 Poor urinary stream: Secondary | ICD-10-CM

## 2021-11-09 ENCOUNTER — Encounter: Payer: Self-pay | Admitting: Urology

## 2021-11-09 ENCOUNTER — Other Ambulatory Visit: Payer: Self-pay

## 2021-11-09 ENCOUNTER — Ambulatory Visit (INDEPENDENT_AMBULATORY_CARE_PROVIDER_SITE_OTHER): Payer: Medicare HMO | Admitting: Urology

## 2021-11-09 VITALS — BP 119/73 | HR 64

## 2021-11-09 DIAGNOSIS — N5201 Erectile dysfunction due to arterial insufficiency: Secondary | ICD-10-CM | POA: Diagnosis not present

## 2021-11-09 NOTE — Patient Instructions (Signed)
Erectile Dysfunction °Erectile dysfunction (ED) is the inability to get or keep an erection in order to have sexual intercourse. ED is considered a symptom of an underlying disorder and is not considered a disease. ED may include: °Inability to get an erection. °Lack of enough hardness of the erection to allow penetration. °Loss of erection before sex is finished. °What are the causes? °This condition may be caused by: °Physical causes, such as: °Artery problems. This may include heart disease, high blood pressure, atherosclerosis, and diabetes. °Hormonal problems, such as low testosterone. °Obesity. °Nerve problems. This may include back or pelvic injuries, multiple sclerosis, Parkinson's disease, spinal cord injury, and stroke. °Certain medicines, such as: °Pain relievers. °Antidepressants. °Blood pressure medicines and water pills (diuretics). °Cancer medicines. °Antihistamines. °Muscle relaxants. °Lifestyle factors, such as: °Use of drugs such as marijuana, cocaine, or opioids. °Excessive use of alcohol. °Smoking. °Lack of physical activity or exercise. °Psychological causes, such as: °Anxiety or stress. °Sadness or depression. °Exhaustion. °Fear about sexual performance. °Guilt. °What are the signs or symptoms? °Symptoms of this condition include: °Inability to get an erection. °Lack of enough hardness of the erection to allow penetration. °Loss of the erection before sex is finished. °Sometimes having normal erections, but with frequent unsatisfactory episodes. °Low sexual satisfaction in either partner due to erection problems. °A curved penis occurring with erection. The curve may cause pain, or the penis may be too curved to allow for intercourse. °Never having nighttime or morning erections. °How is this diagnosed? °This condition is often diagnosed by: °Performing a physical exam to find other diseases or specific problems with the penis. °Asking you detailed questions about the problem. °Doing tests,  such as: °Blood tests to check for diabetes mellitus or high cholesterol, or to measure hormone levels. °Other tests to check for underlying health conditions. °An ultrasound exam to check for scarring. °A test to check blood flow to the penis. °Doing a sleep study at home to measure nighttime erections. °How is this treated? °This condition may be treated by: °Medicines, such as: °Medicine taken by mouth to help you achieve an erection (oral medicine). °Hormone replacement therapy to replace low testosterone levels. °Medicine that is injected into the penis. Your health care provider may instruct you how to give yourself these injections at home. °Medicine that is delivered with a short applicator tube. The tube is inserted into the opening at the tip of the penis, which is the opening of the urethra. A tiny pellet of medicine is put in the urethra. The pellet dissolves and enhances erectile function. This is also called MUSE (medicated urethral system for erections) therapy. °Vacuum pump. This is a pump with a ring on it. The pump and ring are placed on the penis and used to create pressure that helps the penis become erect. °Penile implant surgery. In this procedure, you may receive: °An inflatable implant. This consists of cylinders, a pump, and a reservoir. The cylinders can be inflated with a fluid that helps to create an erection, and they can be deflated after intercourse. °A semi-rigid implant. This consists of two silicone rubber rods. The rods provide some rigidity. They are also flexible, so the penis can both curve downward in its normal position and become straight for sexual intercourse. °Blood vessel surgery to improve blood flow to the penis. During this procedure, a blood vessel from a different part of the body is placed into the penis to allow blood to flow around (bypass) damaged or blocked blood vessels. °Lifestyle changes,   such as exercising more, losing weight, and quitting smoking. °Follow  these instructions at home: °Medicines ° °Take over-the-counter and prescription medicines only as told by your health care provider. Do not increase the dosage without first discussing it with your health care provider. °If you are using self-injections, do injections as directed by your health care provider. Make sure you avoid any veins that are on the surface of the penis. After giving an injection, apply pressure to the injection site for 5 minutes. °Talk to your health care provider about how to prevent headaches while taking ED medicines. These medicines may cause a sudden headache due to the increase in blood flow in your body. °General instructions °Exercise regularly, as directed by your health care provider. Work with your health care provider to lose weight, if needed. °Do not use any products that contain nicotine or tobacco. These products include cigarettes, chewing tobacco, and vaping devices, such as e-cigarettes. If you need help quitting, ask your health care provider. °Before using a vacuum pump, read the instructions that come with the pump and discuss any questions with your health care provider. °Keep all follow-up visits. This is important. °Contact a health care provider if: °You feel nauseous. °You are vomiting. °You get sudden headaches while taking ED medicines. °You have any concerns about your sexual health. °Get help right away if: °You are taking oral or injectable medicines and you have an erection that lasts longer than 4 hours. If your health care provider is unavailable, go to the nearest emergency room for evaluation. An erection that lasts much longer than 4 hours can result in permanent damage to your penis. °You have severe pain in your groin or abdomen. °You develop redness or severe swelling of your penis. °You have redness spreading at your groin or lower abdomen. °You are unable to urinate. °You experience chest pain or a rapid heartbeat (palpitations) after taking oral  medicines. °These symptoms may represent a serious problem that is an emergency. Do not wait to see if the symptoms will go away. Get medical help right away. Call your local emergency services (911 in the [place] Do not drive yourself to the hospital. °Summary °Erectile dysfunction (ED) is the inability to get or keep an erection during sexual intercourse. °This condition is diagnosed based on a physical exam, your symptoms, and tests to determine the cause. Treatment varies depending on the cause and may include medicines, hormone therapy, surgery, or a vacuum pump. °You may need follow-up visits to make sure that you are using your medicines or devices correctly. °Get help right away if you are taking or injecting medicines and you have an erection that lasts longer than 4 hours. °This information is not intended to replace advice given to you by your health care provider. Make sure you discuss any questions you have with your health care provider. °Document Revised: 01/20/2021 Document Reviewed: 01/20/2021 °Elsevier Patient Education © 2022 [employer] ° °

## 2021-11-09 NOTE — Progress Notes (Signed)
11/09/2021 9:28 AM   Gardiner Cortinas [birth year] [id number]  Referring provider: No referring provider defined for this encounter.  Erectile dysfunction   HPI: [name] is a 76yo here for followup for erectile dysfunction. He is here for trimix teaching.   PMH: Past Medical History:  Diagnosis Date   History of radiation therapy 07/07/14-08/29/14   prostate 7605 cGy 39 sessions   Prostate cancer ([place]) 02/17/14   Gleason 3+3, volume 91.34 cc    Surgical History: Past Surgical History:  Procedure Laterality Date   CIRCUMCISION     PROSTATE BIOPSY  02/17/14   gleason 3+3=6, 91.34 cc    Home Medications:  Allergies as of 11/09/2021   No Known Allergies      Medication List        Accurate as of November 09, 2021  9:28 AM. If you have any questions, ask your nurse or doctor.          alfuzosin 10 MG 24 hr tablet Commonly known as: UROXATRAL Take 1 tablet (10 mg total) by mouth at bedtime.   AMBULATORY NON FORMULARY MEDICATION 0.2 mLs by Intracavernosal route as needed. Medication Name: Trimix  PGE 60mcg Pap 30mg  Phent 1mg    finasteride 5 MG tablet Commonly known as: PROSCAR Take 1 tablet (5 mg total) by mouth daily.   fluticasone 50 MCG/ACT nasal spray Commonly known as: FLONASE Place 2 sprays into both nostrils daily.   ibuprofen 600 MG tablet Commonly known as: ADVIL Take 1 tablet (600 mg total) by mouth every 6 (six) hours as needed. What changed: reasons to take this   mirabegron ER 25 MG Tb24 tablet Commonly known as: MYRBETRIQ Take 1 tablet (25 mg total) by mouth daily.   sildenafil 100 MG tablet Commonly known as: VIAGRA Take 1 tablet (100 mg total) by mouth daily as needed for erectile dysfunction.   tamsulosin 0.4 MG Caps capsule Commonly known as: FLOMAX Take 1 capsule (0.4 mg total) by mouth daily.   vardenafil 20 MG tablet Commonly known as: LEVITRA Take 1 tablet (20 mg total) by mouth daily as needed for erectile dysfunction.         Allergies: No Known Allergies  Family History: Family History  Problem Relation Age of Onset   Hypertension Father     Social History:  reports that he has never smoked. He has never used smokeless tobacco. He reports current alcohol use. He reports that he does not use drugs.  ROS: All other review of systems were reviewed and are negative except what is noted above in HPI  Physical Exam: BP 119/73    Pulse 64   Constitutional:  Alert and oriented, No acute distress. HEENT: [place] AT, moist mucus membranes.  Trachea midline, no masses. Cardiovascular: No clubbing, cyanosis, or edema. Respiratory: Normal respiratory effort, no increased work of breathing. GI: Abdomen is soft, nontender, nondistended, no abdominal masses GU: No CVA tenderness.  Lymph: No cervical or inguinal lymphadenopathy. Skin: No rashes, bruises or suspicious lesions. Neurologic: Grossly intact, no focal deficits, moving all 4 extremities. Psychiatric: Normal mood and affect.  Laboratory Data: Lab Results  Component Value Date   WBC 5.0 02/18/2021   HGB 13.0 02/18/2021   HCT 38.8 (L) 02/18/2021   MCV 95.6 02/18/2021   PLT 147 (L) 02/18/2021    Lab Results  Component Value Date   CREATININE 1.14 02/18/2021    No results found for: PSA  No results found for: TESTOSTERONE  No results found for: HGBA1C  Urinalysis    Component Value Date/Time   COLORURINE YELLOW 07/13/2017 0948   APPEARANCEUR Clear 10/18/2021 0940   LABSPEC 1.025 08/17/2020 1556   LABSPEC 1.010 07/16/2014 0916   PHURINE 5.5 08/17/2020 1556   GLUCOSEU Negative 10/18/2021 0940   GLUCOSEU NEGATIVE 07/13/2017 0948   GLUCOSEU Negative 07/16/2014 0916   HGBUR NEGATIVE 08/17/2020 1556   BILIRUBINUR Negative 10/18/2021 0940   BILIRUBINUR Negative 07/16/2014 0916   KETONESUR NEGATIVE 08/17/2020 1556   PROTEINUR Negative 10/18/2021 0940   PROTEINUR NEGATIVE 08/17/2020 1556   UROBILINOGEN 0.2 08/17/2020 1556   UROBILINOGEN  0.2 07/16/2014 0916   NITRITE Negative 10/18/2021 0940   NITRITE NEGATIVE 08/17/2020 1556   LEUKOCYTESUR Negative 10/18/2021 0940   LEUKOCYTESUR NEGATIVE 08/17/2020 1556   LEUKOCYTESUR Negative 07/16/2014 0916    Lab Results  Component Value Date   LABMICR Comment 10/18/2021   BACTERIA Negative 07/16/2014    Pertinent Imaging:  No results found for this or any previous visit.  No results found for this or any previous visit.  No results found for this or any previous visit.  No results found for this or any previous visit.  No results found for this or any previous visit.  No results found for this or any previous visit.  No results found for this or any previous visit.  No results found for this or any previous visit.  Trimix injection instruction:  Patient instructed to draw 0.1ml of trimix into the insulin syringe. I them instructed him to inject at the mid penile shaft at either the 3 or 9 o'clock position. I then injected the patient and he achieved a good erection in 20 minutes. Penile injection completed.   Assessment & Plan:    1. Erectile dysfunction due to arterial insufficiency The patient was instructed on proper technique for trimix injection. He is instructed to alternate side and location of the injection. He was instructed in titrating the trimix dose. He is instructed to call the office for an erection lasting more than 4 hours    No follow-ups on file.  Nicolette Bang, MD  [facility]

## 2022-01-17 ENCOUNTER — Other Ambulatory Visit: Payer: Self-pay | Admitting: Urology

## 2022-01-24 ENCOUNTER — Encounter (HOSPITAL_COMMUNITY): Payer: Self-pay | Admitting: Emergency Medicine

## 2022-01-24 ENCOUNTER — Emergency Department (HOSPITAL_COMMUNITY)
Admission: EM | Admit: 2022-01-24 | Discharge: 2022-01-24 | Disposition: A | Discharge status: Home / Self Care | Payer: Medicare HMO | Attending: Student | Admitting: Student

## 2022-01-24 ENCOUNTER — Other Ambulatory Visit: Payer: Self-pay

## 2022-01-24 DIAGNOSIS — G629 Polyneuropathy, unspecified: Secondary | ICD-10-CM | POA: Insufficient documentation

## 2022-01-24 DIAGNOSIS — Z8546 Personal history of malignant neoplasm of prostate: Secondary | ICD-10-CM | POA: Diagnosis not present

## 2022-01-24 DIAGNOSIS — R200 Anesthesia of skin: Secondary | ICD-10-CM | POA: Diagnosis present

## 2022-01-24 LAB — COMPREHENSIVE METABOLIC PANEL
ALT: 15 U/L (ref 0–44)
AST: 21 U/L (ref 15–41)
Albumin: 4.1 g/dL (ref 3.5–5.0)
Alkaline Phosphatase: 77 U/L (ref 38–126)
Anion gap: 5 (ref 5–15)
BUN: 16 mg/dL (ref 8–23)
CO2: 26 mmol/L (ref 22–32)
Calcium: 9.8 mg/dL (ref 8.9–10.3)
Chloride: 106 mmol/L (ref 98–111)
Creatinine, Ser: 1.06 mg/dL (ref 0.61–1.24)
GFR, Estimated: >60 mL/min (ref >60)
Glucose, Bld: 78 mg/dL (ref 70–99)
Potassium: 5 mmol/L (ref 3.5–5.1)
Sodium: 137 mmol/L (ref 135–145)
Total Bilirubin: 0.4 mg/dL (ref 0.3–1.2)
Total Protein: 7.1 g/dL (ref 6.5–8.1)

## 2022-01-24 LAB — CBC WITH DIFFERENTIAL/PLATELET
Abs Immature Granulocytes: 0 10*3/uL (ref 0.00–0.07)
Basophils Absolute: 0 10*3/uL (ref 0.0–0.1)
Basophils Relative: 1 %
Eosinophils Absolute: 0.2 10*3/uL (ref 0.0–0.5)
Eosinophils Relative: 4 %
HCT: 46.9 % (ref 39.0–52.0)
Hemoglobin: 15.5 g/dL (ref 13.0–17.0)
Immature Granulocytes: 0 %
Lymphocytes Relative: 46 %
Lymphs Abs: 2 10*3/uL (ref 0.7–4.0)
MCH: 31.6 pg (ref 26.0–34.0)
MCHC: 33 g/dL (ref 30.0–36.0)
MCV: 95.7 fL (ref 80.0–100.0)
Monocytes Absolute: 0.4 10*3/uL (ref 0.1–1.0)
Monocytes Relative: 10 %
Neutro Abs: 1.6 10*3/uL — ABNORMAL LOW (ref 1.7–7.7)
Neutrophils Relative %: 39 %
Platelets: 175 10*3/uL (ref 150–400)
RBC: 4.9 MIL/uL (ref 4.22–5.81)
RDW: 13 % (ref 11.5–15.5)
WBC: 4.3 10*3/uL (ref 4.0–10.5)
nRBC: 0 % (ref 0.0–0.2)

## 2022-01-24 LAB — VITAMIN B12: Vitamin B-12: 481 pg/mL (ref 180–914)

## 2022-01-24 LAB — PSA: Prostatic Specific Antigen: 0.1 ng/mL (ref 0.00–4.00)

## 2022-01-24 MED ORDER — GABAPENTIN 100 MG PO CAPS: 100.0000 mg | ORAL_CAPSULE | Freq: Two times a day (BID) | ORAL | 0 refills | Status: AC

## 2022-01-24 NOTE — ED Provider Triage Note (Signed)
Emergency Medicine Provider Triage Evaluation Note ? ?Marvin Parker , a 76 y.o. male  was evaluated in triage.  Pt complains of paresthesias to torso, upper and lower extremities.  These have been intermittent for the past 3 weeks without specific aggravating or alleviating factor.  He states that it feels like "insects on my body."  Denies any numbness, weakness, headache, chest pain, fever or recent changes to medications or lifestyle prior to symptom onset. ? ?Review of Systems  ?Positive: Paresthesias ?Negative: See above ? ?Physical Exam  ?BP (!) 161/87 (BP Location: Right Arm)   Pulse (!) 56   Temp 97.7 ?F (36.5 ?C) (Oral)   Resp 18   SpO2 97%  ?Gen:   Awake, no distress   ?Resp:  Normal effort  ?MSK:   Moves extremities without difficulty  ?Other:  Strength 5/5 in bilateral upper and lower extremities normal sensation to light touch of bilateral upper and lower extremities ? ?Medical Decision Making  ?Medically screening exam initiated at 11:41 AM.  Appropriate orders placed.  Adiel Bremer was informed that the remainder of the evaluation will be completed by another provider, this initial triage assessment does not replace that evaluation, and the importance of remaining in the ED until their evaluation is complete. ? ?Labs ordered ?  ?Delia Heady, PA-C ?01/24/22 1142 ? ?

## 2022-01-24 NOTE — ED Triage Notes (Signed)
Patient  complains of generalized intermittent in torso and bilateral upper and lower extremities starting approximately three weeks ago. Denies loss of sensation in any extremities, no unilateral weakness. Patient is alert, oriented, and in no apparent distress at this time. ?

## 2022-01-24 NOTE — ED Provider Notes (Signed)
[facility] EMERGENCY DEPARTMENT Provider Note  CSN: [id number] Arrival date & time: 01/24/22 8413  Chief Complaint(s) No chief complaint on file.  HPI Marvin Parker is a 76 y.o. male with PMH prostate cancer status post radiation who presents the emergency department for evaluation of numbness and tingling.  Patient states that he has noticed intermittent tingling of his hands and feet and is primarily concerned that his prostate cancer has come back.  He denies weight loss, difficulty with urination, chest pain, shortness of breath, abdominal pain with nausea, vomiting, weakness or other systemic or neurologic complaints.  HPI  Past Medical History Past Medical History:  Diagnosis Date   History of radiation therapy 07/07/14-08/29/14   prostate 7605 cGy 39 sessions   Prostate cancer ([facility]) 02/17/14   Gleason 3+3, volume 91.34 cc   Patient Active Problem List   Diagnosis Date Noted   Lower extremity edema 07/13/2017   Erectile dysfunction 12/20/2016   Dysuria 07/15/2014   Malignant neoplasm of prostate ([facility]) 04/23/2014   ABDOMINAL PAIN, PERIUMBILIC 03/29/2010   Home Medication(s) Prior to Admission medications   Medication Sig Start Date End Date Taking? Authorizing Provider  gabapentin (NEURONTIN) 100 MG capsule Take 1 capsule (100 mg total) by mouth in the morning and at bedtime for 15 days. 01/24/22 02/08/22 Yes Gianna Calef, MD  alfuzosin (UROXATRAL) 10 MG 24 hr tablet Take 1 tablet (10 mg total) by mouth at bedtime. Patient not taking: Reported on 10/18/2021 09/30/20   Malen Gauze, MD  AMBULATORY NON FORMULARY MEDICATION 0.2 mLs by Intracavernosal route as needed. Medication Name: Trimix  PGE Pap 30mg  Phent 1mg  10/18/21   McKenzie, Mardene Celeste, MD  finasteride (PROSCAR) 5 MG tablet Take 1 tablet (5 mg total) by mouth daily. 11/16/20   McKenzie, Mardene Celeste, MD  fluticasone (FLONASE) 50 MCG/ACT nasal spray Place 2 sprays into both nostrils daily.  12/07/20   [provider]  ibuprofen (ADVIL) 600 MG tablet Take 1 tablet (600 mg total) by mouth every 6 (six) hours as needed. Patient taking differently: Take 600 mg by mouth every 6 (six) hours as needed for mild pain or headache. 11/17/20   Linwood Dibbles, MD  mirabegron ER (MYRBETRIQ) 25 MG TB24 tablet Take 1 tablet (25 mg total) by mouth daily. 10/18/21   McKenzie, Mardene Celeste, MD  sildenafil (VIAGRA) 100 MG tablet Take 1 tablet (100 mg total) by mouth daily as needed for erectile dysfunction. 09/30/20   McKenzie, Mardene Celeste, MD  tamsulosin (FLOMAX) 0.4 MG CAPS capsule TAKE 1 CAPSULE BY MOUTH EVERY DAY 01/18/22   McKenzie, Mardene Celeste, MD  vardenafil (LEVITRA) 20 MG tablet Take 1 tablet (20 mg total) by mouth daily as needed for erectile dysfunction. 11/16/20   McKenzie, Mardene Celeste, MD  Past Surgical History Past Surgical History:  Procedure Laterality Date   CIRCUMCISION     PROSTATE BIOPSY  02/17/14   gleason 3+3=6, 91.34 cc   Family History Family History  Problem Relation Age of Onset   Hypertension Father     Social History Social History   Tobacco Use   Smoking status: Never   Smokeless tobacco: Never  Vaping Use   Vaping Use: Never used  Substance Use Topics   Alcohol use: Yes    Comment: Once a month   Drug use: No   Allergies Patient has no known allergies.  Review of Systems Review of Systems  Neurological:        Tingling   Physical Exam Vital Signs  I have reviewed the triage vital signs BP (!) 158/85   Pulse (!) 50   Temp 97.8 F (36.6 C)   Resp 16   SpO2 100%   Physical Exam Vitals and nursing note reviewed.  Constitutional:      General: He is not in acute distress.    Appearance: He is well-developed.  HENT:     Head: Normocephalic and atraumatic.  Eyes:     Conjunctiva/sclera: Conjunctivae normal.   Cardiovascular:     Rate and Rhythm: Normal rate and regular rhythm.     Heart sounds: No murmur heard. Pulmonary:     Effort: Pulmonary effort is normal. No respiratory distress.     Breath sounds: Normal breath sounds.  Abdominal:     Palpations: Abdomen is soft.     Tenderness: There is no abdominal tenderness.  Musculoskeletal:        General: No swelling.     Cervical back: Neck supple.  Skin:    General: Skin is warm and dry.     Capillary Refill: Capillary refill takes less than 2 seconds.  Neurological:     Mental Status: He is alert.     Cranial Nerves: No cranial nerve deficit.     Sensory: No sensory deficit.     Motor: No weakness.  Psychiatric:        Mood and Affect: Mood normal.    ED Results and Treatments Labs (all labs ordered are listed, but only abnormal results are displayed) Labs Reviewed  CBC WITH DIFFERENTIAL/PLATELET - Abnormal; Notable for the following components:      Result Value   Neutro Abs 1.6 (*)    All other components within normal limits  COMPREHENSIVE METABOLIC PANEL  PSA  VITAMIN B12                                                                                                                          Radiology No results found.  Pertinent labs & imaging results that were available during my care of the patient were reviewed by me and considered in my medical decision making (see MDM for details).  Medications Ordered in ED Medications - No data to display  Procedures Procedures  (including critical care time)  Medical Decision Making / ED Course   This patient presents to the ED for concern of tingling, this involves an extensive number of treatment options, and is a complaint that carries with it a high risk of complications and morbidity.  The differential diagnosis includes neuropathy, B12  deficiency, dry beriberi, cervical compression  MDM: Patient seen emergency department for evaluation of tingling.  Physical exam is unremarkable with no focal motor or sensory deficits, no cranial nerve deficits.  Laboratory evaluation unremarkable including a negative PSA and a normal B12.  Patient then discharged with primary care follow-up and a initial trial of gabapentin for neuropathy.   Additional history obtained: -External records from outside source obtained and reviewed including: Chart review including previous notes, labs, imaging, consultation notes   Lab Tests: -I ordered, reviewed, and interpreted labs.   The pertinent results include:   Labs Reviewed  CBC WITH DIFFERENTIAL/PLATELET - Abnormal; Notable for the following components:      Result Value   Neutro Abs 1.6 (*)    All other components within normal limits  COMPREHENSIVE METABOLIC PANEL  PSA  VITAMIN B12      EKG   EKG Interpretation  Date/Time:  Monday January 24 2022 17:14:41 EDT Ventricular Rate:  51 PR Interval:  196 QRS Duration: 90 QT Interval:  420 QTC Calculation: 387 R Axis:   49 Text Interpretation: Sinus rhythm Confirmed by Amare Kontos ([id number]) on 01/24/2022 10:28:42 PM          Medicines ordered and prescription drug management: Meds ordered this encounter  Medications   gabapentin (NEURONTIN) 100 MG capsule    Sig: Take 1 capsule (100 mg total) by mouth in the morning and at bedtime for 15 days.    Dispense:  30 capsule    Refill:  0    -I have reviewed the patients home medicines and have made adjustments as needed  Critical interventions none Cardiac Monitoring: The patient was maintained on a cardiac monitor.  I personally viewed and interpreted the cardiac monitored which showed an underlying rhythm of: NSR  Social Determinants of Health:  Factors impacting patients care include: none   Reevaluation: After the interventions noted above, I reevaluated the patient and  found that they have :improved  Co morbidities that complicate the patient evaluation  Past Medical History:  Diagnosis Date   History of radiation therapy 07/07/14-08/29/14   prostate 7605 cGy 39 sessions   Prostate cancer ([facility]) 02/17/14   Gleason 3+3, volume 91.34 cc      Dispostion: I considered admission for this patient, but his tingling does not meet inpatient criteria for admission and he is safe for discharge with outpatient follow-up     Final Clinical Impression(s) / ED Diagnoses Final diagnoses:  Neuropathy     @PCDICTATION @    Glendora Score, MD 01/24/22 2229

## 2022-07-31 IMAGING — CR DG WRIST COMPLETE 3+V*R*
4 series · 4 of 4 positions shown · non-contrast
Comparison: Right elbow radiographs-earlier same day

CLINICAL DATA: Post fall, now with right shoulder, elbow and wrist
pain.

EXAM:
RIGHT WRIST - COMPLETE 3+ VIEW

[wrist pa]
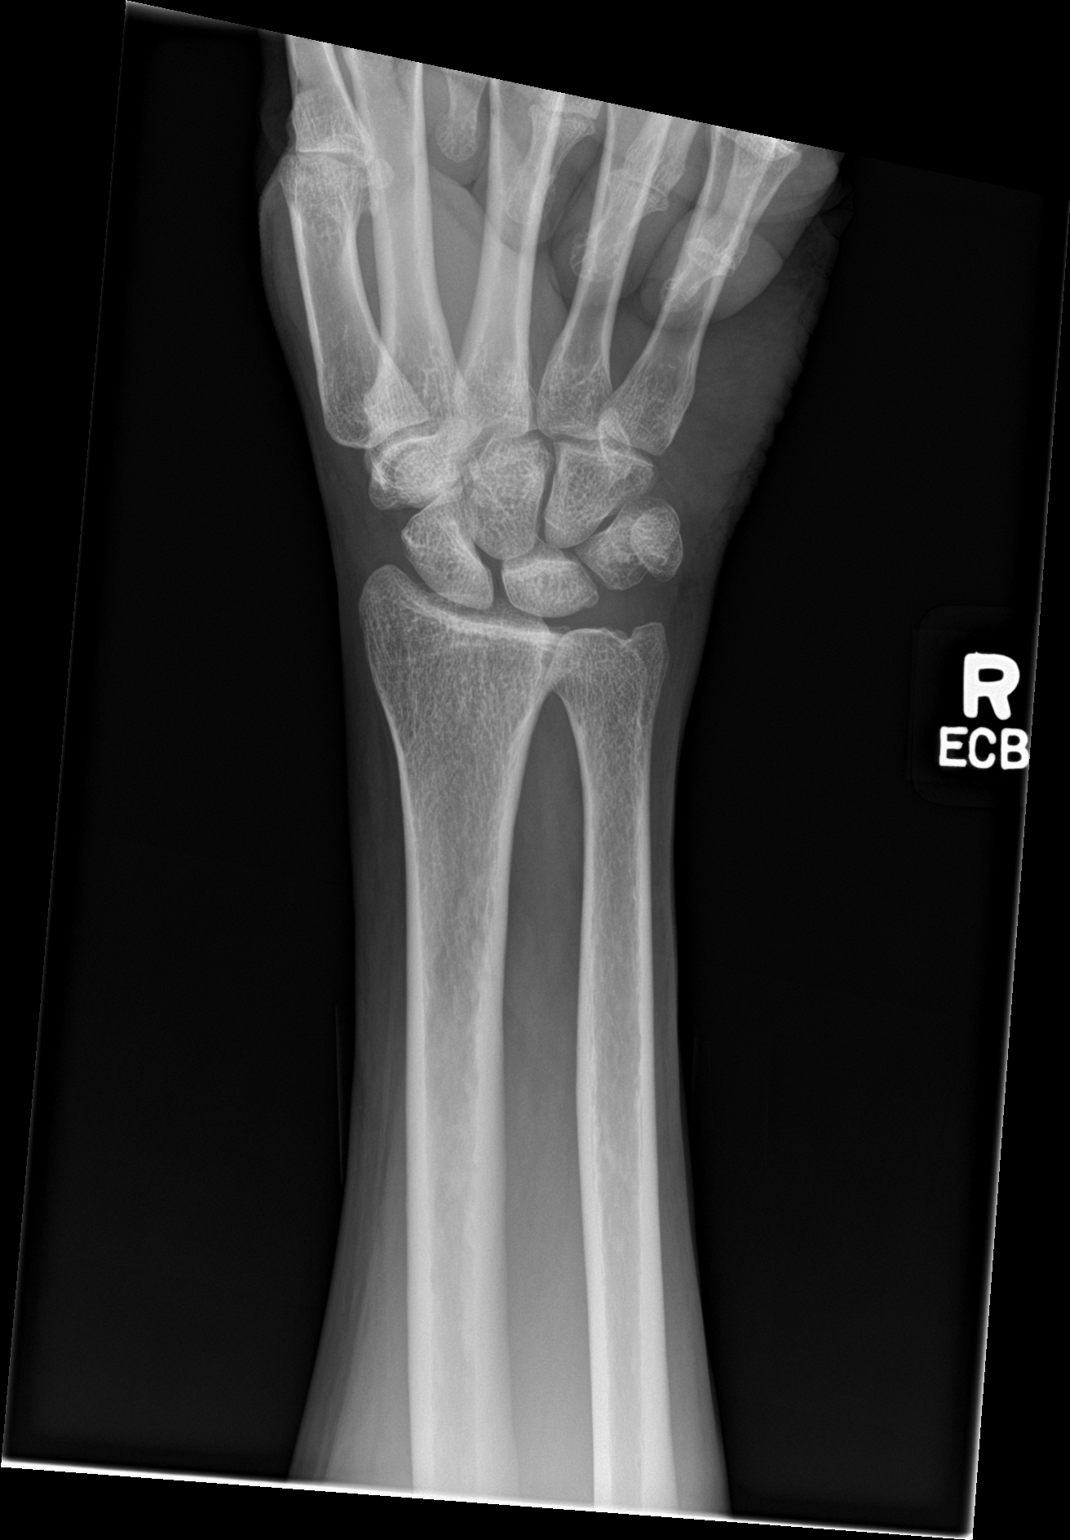

[wrist obl]
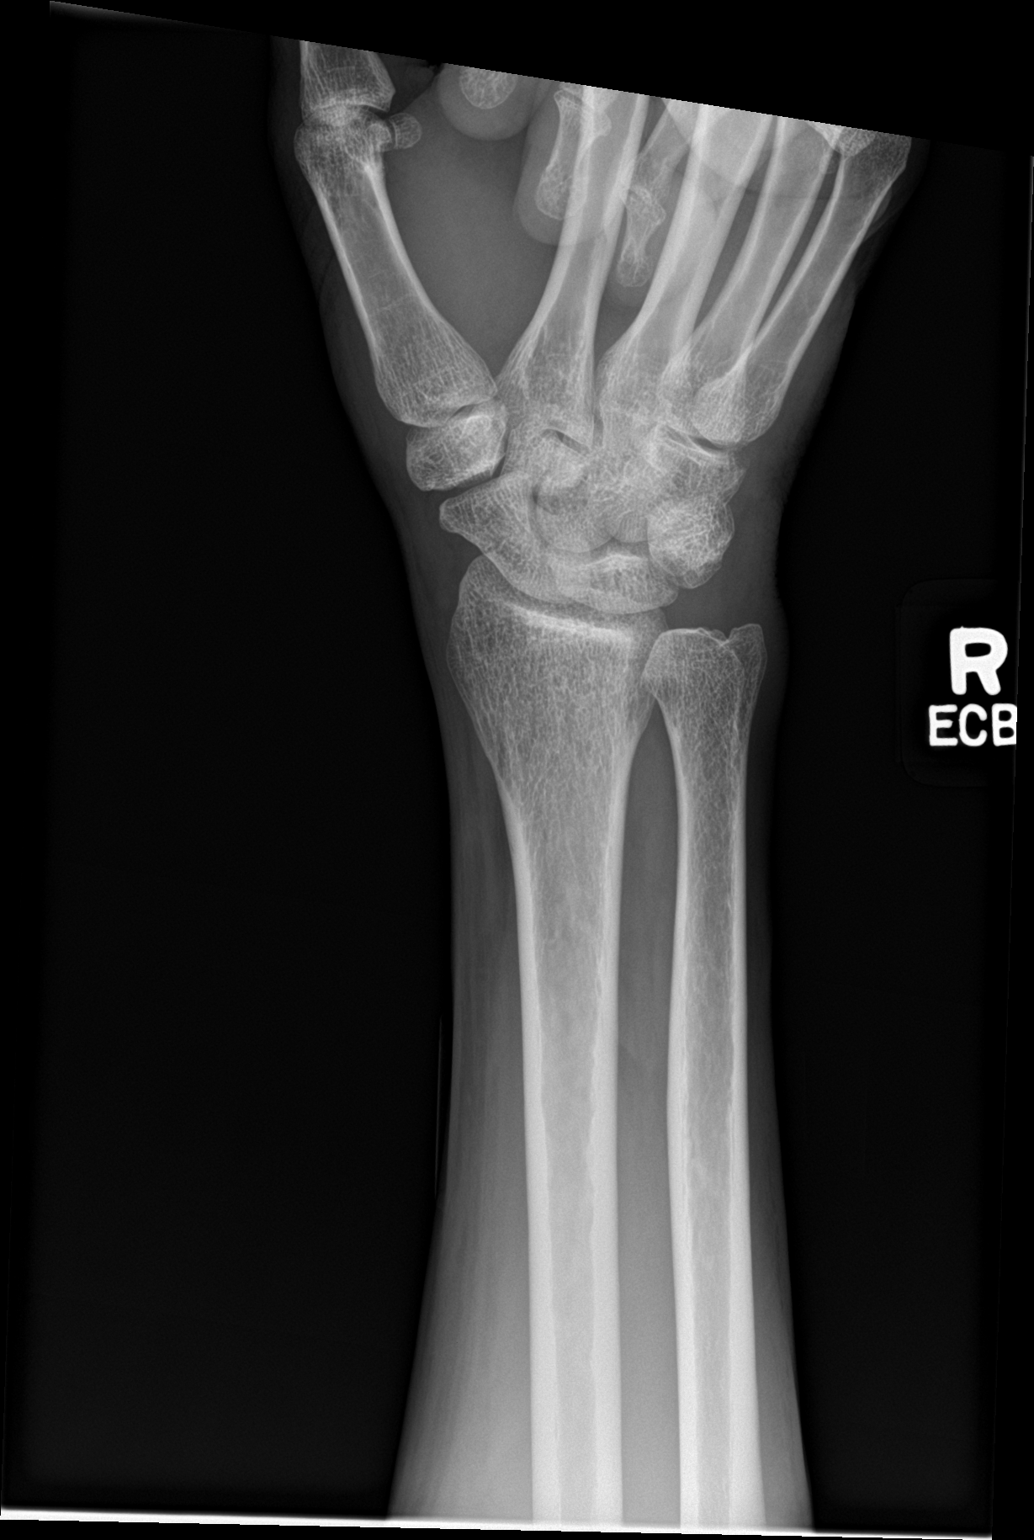

[wrist lat]
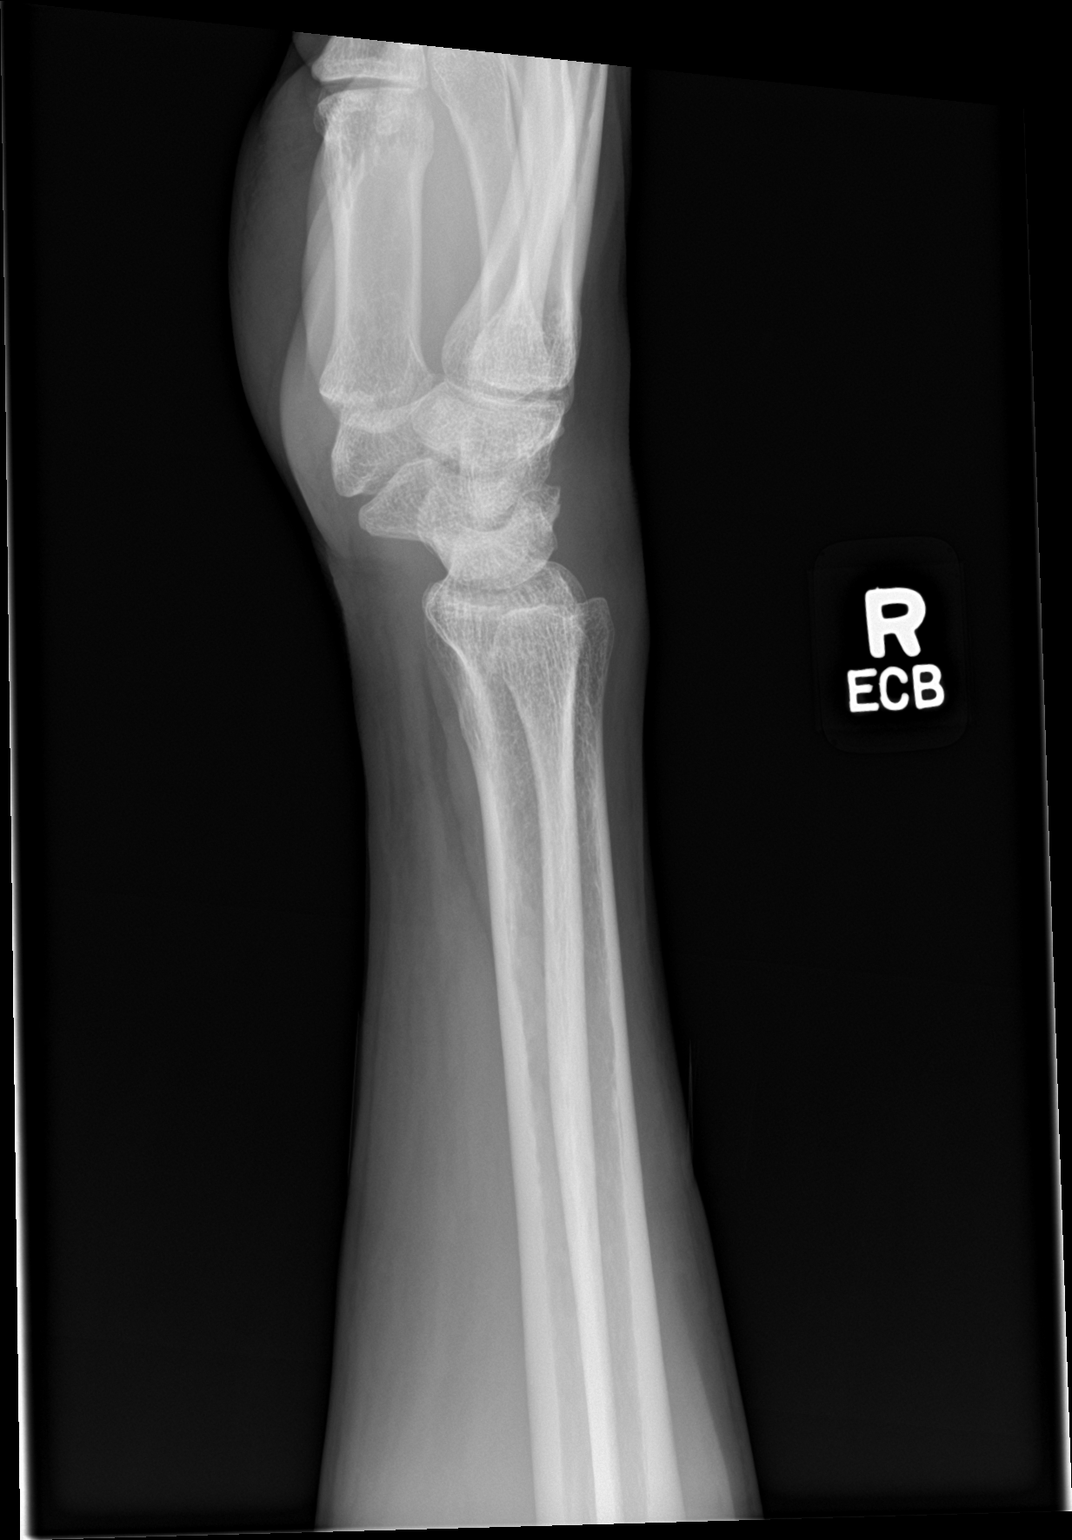

[wrist navicular]
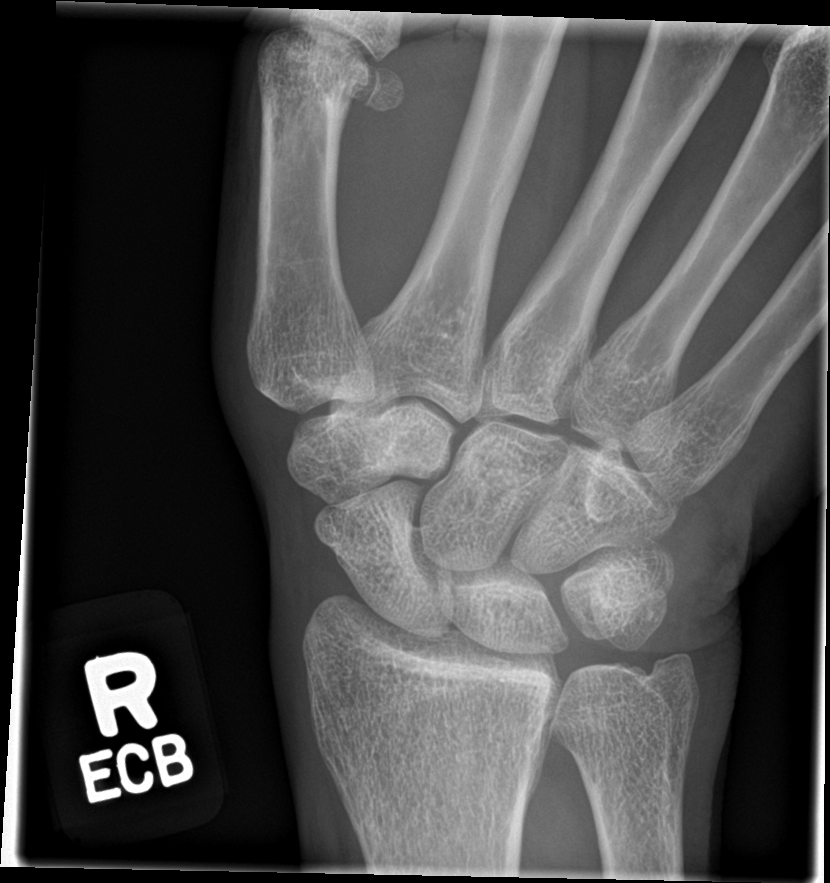

[4 of 4 positions shown; findings below may reference images not displayed]

FINDINGS: No fracture or dislocation. Joint spaces are preserved. No erosions.
No evidence of chondrocalcinosis. Regional soft tissues appear
normal.
IMPRESSION: No fracture. If the patient has pain referable to the anatomic snuff
box, splinting and a follow-up radiograph in 10 to 14 days is
recommended to evaluate for occult scaphoid fracture.

## 2022-08-24 ENCOUNTER — Encounter (HOSPITAL_COMMUNITY): Payer: Self-pay | Admitting: Emergency Medicine

## 2022-08-24 ENCOUNTER — Ambulatory Visit (HOSPITAL_COMMUNITY)
Admission: EM | Admit: 2022-08-24 | Discharge: 2022-08-24 | Disposition: A | Discharge status: Home / Self Care | Payer: Medicare HMO

## 2022-08-24 DIAGNOSIS — R040 Epistaxis: Secondary | ICD-10-CM | POA: Diagnosis not present

## 2022-08-24 NOTE — Discharge Instructions (Addendum)
Your symptoms are most likely related to seasonal weather changes and allergies  Begin taking your Zyrtec daily as well as using your fluticasone nasal spray daily, if you have run out of either medicine you may purchase them at any of the local pharmacies  Continue to apply pressure when nosebleeds occur to help resolve, there is information in your packet on how to properly do this  Symptoms are most likely exacerbated to a dry airway which is most likely due to the weather change  You may coat nose using the Q-tip and petroleum jelly or similar product every evening and every morning to keep it moistened  May use a humidifier throughout the night, if you do not have a humidifier you may crack your bedroom window, rotate ceiling fan or sit inside of a steamed bathroom for 5 to 10 minutes prior to bedtime  Blood pressure slightly elevated today at 151/69, after reviewing your chart this is not your normal, please check blood pressure daily at home when you are in a calm quiet environment and record, if blood pressure remains elevated greater than 140 please let primary doctor know  If your nosebleeds continue to recur please follow-up with ear nose and throat, information is listed on front paperwork

## 2022-08-24 NOTE — ED Triage Notes (Signed)
Pt reports having intermittent nose bleeds since Monday. Denies taking blood thinners.

## 2022-08-24 NOTE — ED Provider Notes (Signed)
[facility]    CSN: [id number] Arrival date & time: 08/24/22  6503      History   Chief Complaint Chief Complaint  Patient presents with   Epistaxis    HPI Marvin Parker is a 76 y.o. male.   Patient presents for evaluation of occurrence of nosebleed daily for 3 days.  Last occurrence this morning, typically occur upon awakening.  Nosebleeds lasting about 10 minutes before resolution with manual pressure applied.  Unsure which nare is involved. has not attempted additional treatment.  Denies use of anticoagulants or antiplatelets.  Denies cardiac history.  History of seasonal allergies, not taking medication at this time.  Symptoms have occurred before approximately 1 year ago.  Evaluated by ENT at that time, deemed stable.   Past Medical History:  Diagnosis Date   History of radiation therapy 07/07/14-08/29/14   prostate 7605 cGy 39 sessions   Prostate cancer ([place]) 02/17/14   Gleason 3+3, volume 91.34 cc    Patient Active Problem List   Diagnosis Date Noted   Lower extremity edema 07/13/2017   Erectile dysfunction 12/20/2016   Dysuria 07/15/2014   Malignant neoplasm of prostate ([place]) 04/23/2014   ABDOMINAL PAIN, PERIUMBILIC 54/65/6812    Past Surgical History:  Procedure Laterality Date   CIRCUMCISION     PROSTATE BIOPSY  02/17/14   gleason 3+3=6, 91.34 cc       Home Medications    Prior to Admission medications   Medication Sig Start Date End Date Taking? Authorizing Provider  alfuzosin (UROXATRAL) 10 MG 24 hr tablet Take 1 tablet (10 mg total) by mouth at bedtime. Patient not taking: Reported on 10/18/2021 09/30/20   Cleon Gustin, MD  AMBULATORY NON FORMULARY MEDICATION 0.2 mLs by Intracavernosal route as needed. Medication Name: Trimix  PGE 4mg Pap '30mg'$  Phent '1mg'$  10/18/21   McKenzie, PCandee Furbish MD  finasteride (PROSCAR) 5 MG tablet Take 1 tablet (5 mg total) by mouth daily. 11/16/20   McKenzie, PCandee Furbish MD  fluticasone (FLONASE) 50  MCG/ACT nasal spray Place 2 sprays into both nostrils daily. 12/07/20   [provider]  gabapentin (NEURONTIN) 100 MG capsule Take 1 capsule (100 mg total) by mouth in the morning and at bedtime for 15 days. 01/24/22 02/08/22  Kommor, [place], MD  ibuprofen (ADVIL) 600 MG tablet Take 1 tablet (600 mg total) by mouth every 6 (six) hours as needed. Patient taking differently: Take 600 mg by mouth every 6 (six) hours as needed for mild pain or headache. 11/17/20   KDorie Rank MD  mirabegron ER (MYRBETRIQ) 25 MG TB24 tablet Take 1 tablet (25 mg total) by mouth daily. 10/18/21   McKenzie, PCandee Furbish MD  sildenafil (VIAGRA) 100 MG tablet Take 1 tablet (100 mg total) by mouth daily as needed for erectile dysfunction. 09/30/20   McKenzie, PCandee Furbish MD  tamsulosin (FLOMAX) 0.4 MG CAPS capsule TAKE 1 CAPSULE BY MOUTH EVERY DAY 01/18/22   McKenzie, PCandee Furbish MD  vardenafil (LEVITRA) 20 MG tablet Take 1 tablet (20 mg total) by mouth daily as needed for erectile dysfunction. 11/16/20   McKenzie, PCandee Furbish MD    Family History Family History  Problem Relation Age of Onset   Hypertension Father     Social History Social History   Tobacco Use   Smoking status: Never   Smokeless tobacco: Never  Vaping Use   Vaping Use: Never used  Substance Use Topics   Alcohol use: Yes    Comment: Once a month  Drug use: No     Allergies   Patient has no known allergies.   Review of Systems Review of Systems  Constitutional: Negative.   HENT:  Positive for nosebleeds. Negative for congestion, dental problem, drooling, ear discharge, ear pain, facial swelling, hearing loss, mouth sores, postnasal drip, rhinorrhea, sinus pressure, sinus pain, sneezing, sore throat, tinnitus, trouble swallowing and voice change.   Respiratory: Negative.    Cardiovascular: Negative.   Gastrointestinal: Negative.      Physical Exam Triage Vital Signs ED Triage Vitals [08/24/22 1016]  Enc Vitals Group     BP (!)  151/69     Pulse Rate (!) 56     Resp 17     Temp 98.2 F (36.8 C)     Temp Source Oral     SpO2 100 %     Weight      Height      Head Circumference      Peak Flow      Pain Score 0     Pain Loc      Pain Edu?      Excl. in [place]?    No data found.  Updated Vital Signs BP (!) 151/69 (BP Location: Right Arm)   Pulse (!) 56   Temp 98.2 F (36.8 C) (Oral)   Resp 17   SpO2 100%   Visual Acuity Right Eye Distance:   Left Eye Distance:   Bilateral Distance:    Right Eye Near:   Left Eye Near:    Bilateral Near:     Physical Exam Constitutional:      Appearance: Normal appearance.  HENT:     Nose:     Right Turbinates: Swollen.     Left Turbinates: Swollen.  Eyes:     Extraocular Movements: Extraocular movements intact.  Pulmonary:     Effort: Pulmonary effort is normal.  Neurological:     Mental Status: He is alert and oriented to person, place, and time. Mental status is at baseline.      UC Treatments / Results  Labs (all labs ordered are listed, but only abnormal results are displayed) Labs Reviewed - No data to display  EKG   Radiology No results found.  Procedures Procedures (including critical care time)  Medications Ordered in UC Medications - No data to display  Initial Impression / Assessment and Plan / UC Course  I have reviewed the triage vital signs and the nursing notes.  Pertinent labs & imaging results that were available during my care of the patient were reviewed by me and considered in my medical decision making (see chart for details).  Epistasis  Etiology is most likely related to recent weather change and seasonal allergies, blood pressure slightly elevated at 151/69 in triage, per chart review does not patient's baseline, advised to monitor at home and notify PCP if continued elevation, no signs of hypertensive urgency, recommended use of Zyrtec and fluticasone daily, advised moisturizing airway through use of petroleum jelly,  humidifier, steam showers, advised if nosebleeds continue to persist to follow-up with ENT Final Clinical Impressions(s) / UC Diagnoses   Final diagnoses:  None   Discharge Instructions   None    ED Prescriptions   None    PDMP not reviewed this encounter.   Hans Eden, NP 08/24/22 1058

## 2022-11-09 ENCOUNTER — Ambulatory Visit (INDEPENDENT_AMBULATORY_CARE_PROVIDER_SITE_OTHER): Payer: Medicare PPO | Admitting: Urology

## 2022-11-09 ENCOUNTER — Encounter: Payer: Self-pay | Admitting: Urology

## 2022-11-09 VITALS — BP 122/62 | HR 65

## 2022-11-09 DIAGNOSIS — R3912 Poor urinary stream: Secondary | ICD-10-CM

## 2022-11-09 DIAGNOSIS — N401 Enlarged prostate with lower urinary tract symptoms: Secondary | ICD-10-CM

## 2022-11-09 DIAGNOSIS — C61 Malignant neoplasm of prostate: Secondary | ICD-10-CM

## 2022-11-09 DIAGNOSIS — N5201 Erectile dysfunction due to arterial insufficiency: Secondary | ICD-10-CM

## 2022-11-09 DIAGNOSIS — N138 Other obstructive and reflux uropathy: Secondary | ICD-10-CM

## 2022-11-09 MED ORDER — MIRABEGRON ER 25 MG PO TB24
25.0000 mg | ORAL_TABLET | Freq: Every day | ORAL | 11 refills | Status: AC
Start: 2022-11-09 — End: ?

## 2022-11-09 MED ORDER — FINASTERIDE 5 MG PO TABS: 5.0000 mg | ORAL_TABLET | Freq: Every day | ORAL | 3 refills | Status: AC

## 2022-11-09 MED ORDER — TAMSULOSIN HCL 0.4 MG PO CAPS: 0.4000 mg | ORAL_CAPSULE | Freq: Every day | ORAL | 3 refills | Status: AC

## 2022-11-09 NOTE — Patient Instructions (Signed)

## 2022-11-09 NOTE — Progress Notes (Signed)
11/09/2022 10:17 AM   Marvin Parker 1946-10-06 [id number]  Referring provider: Therapy, [facility] [place],  [place] [address]  Followup erectile dysfunction, BPH and prostate cancer   HPI: [name] is a 77yo here for followup for BPH, erectile dysfunction and prostate cancer. PSA 0.1 last year.  IPSS 6 QOL 1 on flomax and finasteride. He previously tried trimix for his erectile dysfunction which did not work. He does not wish to pursue additional therapy. He notes his penile pain improved with mirabegron '25mg'$ .     PMH: Past Medical History:  Diagnosis Date   History of radiation therapy 07/07/14-08/29/14   prostate 7605 cGy 39 sessions   Prostate cancer ([place]) 02/17/14   Gleason 3+3, volume 91.34 cc    Surgical History: Past Surgical History:  Procedure Laterality Date   CIRCUMCISION     PROSTATE BIOPSY  02/17/14   gleason 3+3=6, 91.34 cc    Home Medications:  Allergies as of 11/09/2022   No Known Allergies      Medication List        Accurate as of November 09, 2022 10:17 AM. If you have any questions, ask your nurse or doctor.          alfuzosin 10 MG 24 hr tablet Commonly known as: UROXATRAL Take 1 tablet (10 mg total) by mouth at bedtime.   AMBULATORY NON FORMULARY MEDICATION 0.2 mLs by Intracavernosal route as needed. Medication Name: Trimix  PGE 71mg Pap '30mg'$  Phent '1mg'$    amLODipine 5 MG tablet Commonly known as: NORVASC Take 1 tablet by mouth daily.   finasteride 5 MG tablet Commonly known as: PROSCAR Take 1 tablet (5 mg total) by mouth daily.   fluticasone 50 MCG/ACT nasal spray Commonly known as: FLONASE Place 2 sprays into both nostrils daily.   gabapentin 100 MG capsule Commonly known as: Neurontin Take 1 capsule (100 mg total) by mouth in the morning and at bedtime for 15 days.   ibuprofen 600 MG tablet Commonly known as: ADVIL Take 1 tablet (600 mg total) by mouth every 6 (six)  hours as needed. What changed: reasons to take this   meloxicam 15 MG tablet Commonly known as: MOBIC Take by mouth.   mirabegron ER 25 MG Tb24 tablet Commonly known as: MYRBETRIQ Take 1 tablet (25 mg total) by mouth daily.   sildenafil 100 MG tablet Commonly known as: VIAGRA Take 1 tablet (100 mg total) by mouth daily as needed for erectile dysfunction.   tamsulosin 0.4 MG Caps capsule Commonly known as: FLOMAX TAKE 1 CAPSULE BY MOUTH EVERY DAY   vardenafil 20 MG tablet Commonly known as: LEVITRA Take 1 tablet (20 mg total) by mouth daily as needed for erectile dysfunction.        Allergies: No Known Allergies  Family History: Family History  Problem Relation Age of Onset   Hypertension Father     Social History:  reports that he has never smoked. He has never used smokeless tobacco. He reports current alcohol use. He reports that he does not use drugs.  ROS: All other review of systems were reviewed and are negative except what is noted above in HPI  Physical Exam: BP 122/62   Pulse 65   Constitutional:  Alert and oriented, No acute distress. HEENT: [place] AT, moist mucus membranes.  Trachea midline, no masses. Cardiovascular: No clubbing, cyanosis, or edema. Respiratory: Normal respiratory effort, no increased work of breathing. GI: Abdomen is  soft, nontender, nondistended, no abdominal masses GU: No CVA tenderness.  Lymph: No cervical or inguinal lymphadenopathy. Skin: No rashes, bruises or suspicious lesions. Neurologic: Grossly intact, no focal deficits, moving all 4 extremities. Psychiatric: Normal mood and affect.  Laboratory Data: Lab Results  Component Value Date   WBC 4.3 01/24/2022   HGB 15.5 01/24/2022   HCT 46.9 01/24/2022   MCV 95.7 01/24/2022   PLT 175 01/24/2022    Lab Results  Component Value Date   CREATININE 1.06 01/24/2022    No results found for: "PSA"  No results found for: "TESTOSTERONE"  No results found for:  "HGBA1C"  Urinalysis    Component Value Date/Time   COLORURINE YELLOW 07/13/2017 0948   APPEARANCEUR Clear 10/18/2021 0940   LABSPEC 1.025 08/17/2020 1556   LABSPEC 1.010 07/16/2014 0916   PHURINE 5.5 08/17/2020 1556   GLUCOSEU Negative 10/18/2021 0940   GLUCOSEU NEGATIVE 07/13/2017 0948   GLUCOSEU Negative 07/16/2014 0916   HGBUR NEGATIVE 08/17/2020 1556   BILIRUBINUR Negative 10/18/2021 0940   BILIRUBINUR Negative 07/16/2014 0916   KETONESUR NEGATIVE 08/17/2020 1556   PROTEINUR Negative 10/18/2021 0940   PROTEINUR NEGATIVE 08/17/2020 1556   UROBILINOGEN 0.2 08/17/2020 1556   UROBILINOGEN 0.2 07/16/2014 0916   NITRITE Negative 10/18/2021 0940   NITRITE NEGATIVE 08/17/2020 1556   LEUKOCYTESUR Negative 10/18/2021 0940   LEUKOCYTESUR NEGATIVE 08/17/2020 1556   LEUKOCYTESUR Negative 07/16/2014 0916    Lab Results  Component Value Date   LABMICR Comment 10/18/2021   BACTERIA Negative 07/16/2014    Pertinent Imaging:  No results found for this or any previous visit.  No results found for this or any previous visit.  No results found for this or any previous visit.  No results found for this or any previous visit.  No results found for this or any previous visit.  No valid procedures specified. No results found for this or any previous visit.  No results found for this or any previous visit.   Assessment & Plan:    1. Erectile dysfunction due to arterial insufficiency -Patient defers therapy at this time  2. Benign prostatic hyperplasia with urinary obstruction -continue flomax and finasteride  3. Prostate cancer ([place]) -PSA today  4. Weak urinary stream -continue flomax and fiansteride   No follow-ups on file.  Nicolette Bang, MD  [facility] [place]

## 2022-11-10 LAB — PSA, TOTAL AND FREE
PSA, Free Pct: 20 %
PSA, Free: 0.02 ng/mL
Prostate Specific Ag, Serum: 0.1 ng/mL (ref 0.0–4.0)

## 2022-11-15 NOTE — Progress Notes (Signed)
Letter sent.

## 2022-11-22 ENCOUNTER — Telehealth: Payer: Self-pay

## 2022-11-22 NOTE — Telephone Encounter (Signed)
Patient left a voicemail requesting a call back.  Returned call with no answer, left voicemail to call back.

## 2023-11-06 ENCOUNTER — Other Ambulatory Visit: Payer: Medicaid Other

## 2023-11-13 ENCOUNTER — Ambulatory Visit: Payer: Medicare HMO | Admitting: Urology

## 2023-12-15 ENCOUNTER — Ambulatory Visit: Payer: No Typology Code available for payment source | Admitting: Urology
